# Patient Record
Sex: Male | Born: 1965 | Race: White | Hispanic: No | Marital: Single | Smoking: Former smoker
Health system: Southern US, Community
[De-identification: ages and names within clinical notes are randomized; demographics above are authoritative.]

## PROBLEM LIST (undated history)

## (undated) DIAGNOSIS — G4733 Obstructive sleep apnea (adult) (pediatric): Secondary | ICD-10-CM

## (undated) DIAGNOSIS — I2510 Atherosclerotic heart disease of native coronary artery without angina pectoris: Secondary | ICD-10-CM

## (undated) DIAGNOSIS — R7303 Prediabetes: Secondary | ICD-10-CM

## (undated) DIAGNOSIS — I214 Non-ST elevation (NSTEMI) myocardial infarction: Secondary | ICD-10-CM

## (undated) DIAGNOSIS — I10 Essential (primary) hypertension: Secondary | ICD-10-CM

## (undated) DIAGNOSIS — E785 Hyperlipidemia, unspecified: Secondary | ICD-10-CM

## (undated) HISTORY — DX: Essential (primary) hypertension: I10

## (undated) HISTORY — DX: Prediabetes: R73.03

## (undated) HISTORY — DX: Hyperlipidemia, unspecified: E78.5

## (undated) HISTORY — DX: Obstructive sleep apnea (adult) (pediatric): G47.33

---
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
## 2000-04-24 ENCOUNTER — Other Ambulatory Visit: Admission: RE | Admit: 2000-04-24 | Discharge: 2000-04-24 | Payer: Self-pay | Admitting: Urology

## 2000-04-24 ENCOUNTER — Encounter (INDEPENDENT_AMBULATORY_CARE_PROVIDER_SITE_OTHER): Payer: Self-pay | Admitting: Specialist

## 2005-09-12 ENCOUNTER — Ambulatory Visit: Payer: Self-pay | Admitting: Internal Medicine

## 2005-09-22 ENCOUNTER — Ambulatory Visit: Payer: Self-pay | Admitting: *Deleted

## 2005-10-03 ENCOUNTER — Ambulatory Visit: Payer: Self-pay | Admitting: Internal Medicine

## 2005-10-10 ENCOUNTER — Ambulatory Visit: Payer: Self-pay | Admitting: *Deleted

## 2005-10-31 ENCOUNTER — Ambulatory Visit: Payer: Self-pay | Admitting: *Deleted

## 2005-11-10 ENCOUNTER — Ambulatory Visit: Payer: Self-pay | Admitting: Internal Medicine

## 2005-11-17 ENCOUNTER — Ambulatory Visit: Payer: Self-pay | Admitting: *Deleted

## 2005-12-22 ENCOUNTER — Ambulatory Visit: Payer: Self-pay | Admitting: Internal Medicine

## 2006-04-10 ENCOUNTER — Ambulatory Visit: Payer: Self-pay | Admitting: Internal Medicine

## 2006-10-12 ENCOUNTER — Ambulatory Visit: Payer: Self-pay | Admitting: Internal Medicine

## 2012-04-22 ENCOUNTER — Emergency Department (HOSPITAL_COMMUNITY)
Admission: EM | Admit: 2012-04-22 | Discharge: 2012-04-22 | Disposition: A | Discharge status: Home Health | Payer: Self-pay | Attending: Emergency Medicine | Admitting: Emergency Medicine

## 2012-04-22 ENCOUNTER — Encounter (HOSPITAL_COMMUNITY): Payer: Self-pay | Admitting: Emergency Medicine

## 2012-04-22 ENCOUNTER — Emergency Department (HOSPITAL_COMMUNITY): Payer: Self-pay

## 2012-04-22 DIAGNOSIS — J40 Bronchitis, not specified as acute or chronic: Secondary | ICD-10-CM | POA: Insufficient documentation

## 2012-04-22 MED ORDER — ALBUTEROL SULFATE HFA 108 (90 BASE) MCG/ACT IN AERS
1.0000 | INHALATION_SPRAY | RESPIRATORY_TRACT | Status: DC | PRN
  Administered 2012-04-22: 2 via RESPIRATORY_TRACT
  Filled 2012-04-22: qty 6.7

## 2012-04-22 MED ORDER — PREDNISONE 10 MG PO TABS: 40.0000 mg | ORAL_TABLET | Freq: Every day | ORAL | Status: DC

## 2012-04-22 MED ORDER — HYDROCODONE-HOMATROPINE 5-1.5 MG/5ML PO SYRP: 5.0000 mL | ORAL_SOLUTION | Freq: Three times a day (TID) | ORAL | Status: AC | PRN

## 2012-04-22 MED ORDER — AZITHROMYCIN 250 MG PO TABS: 250.0000 mg | ORAL_TABLET | Freq: Every day | ORAL | Status: AC

## 2012-04-22 NOTE — ED Provider Notes (Signed)
History    This chart was scribed for Rolan Bucco, MD, MD by Smitty Pluck. The patient was seen in room TR07C and the patient's care was started at 1:11PM.   CSN: [id number]  Arrival date & time 04/22/12  1203   First MD Initiated Contact with Patient 04/22/12 1256      Chief Complaint  Patient presents with  . URI    (Consider location/radiation/quality/duration/timing/severity/associated sxs/prior treatment) Patient is a 46 y.o. male presenting with URI. The history is provided by the patient.  URI The primary symptoms include cough.   ABIMELEC GROCHOWSKI is a 46 y.o. male who presents to the Emergency Department complaining of constant moderate cough and congestion onset 3 days. Pt reports that he was coughing up phlegm the 1st day but since then he has had non productive cough. Denies fevers, chills, nausea, abdominal pain, chest pain, rashes. Denies smoking cigarettes. Denies hx of asthma, wheezing and lung problems.  Reports he works in [employer] and is constantly around dust. He reports that he has been unable to sleep due to cough.   History reviewed. No pertinent past medical history.  History reviewed. No pertinent past surgical history.  History reviewed. No pertinent family history.  History  Substance Use Topics  . Smoking status: Never Smoker   . Smokeless tobacco: Not on file  . Alcohol Use: Yes     occasional      Review of Systems  Respiratory: Positive for cough.   10 Systems reviewed and all are negative for acute change except as noted in the HPI.    Allergies  Review of patient's allergies indicates no known allergies.  Home Medications   Current Outpatient Rx  Name Route Sig Dispense Refill  . AZITHROMYCIN 250 MG PO TABS Oral Take 1 tablet (250 mg total) by mouth daily. Take first 2 tablets together, then 1 every day until finished. 6 tablet 0  . HYDROCODONE-HOMATROPINE 5-1.5 MG/5ML PO SYRP Oral Take 5 mLs by mouth every 8 (eight) hours as  needed for cough. 120 mL 0  . PREDNISONE 10 MG PO TABS Oral Take 4 tablets (40 mg total) by mouth daily. 20 tablet 0    BP 134/84  Pulse 94  Temp 98.5 F (36.9 C) (Oral)  Resp 20  SpO2 94%  Physical Exam  Nursing note and vitals reviewed. Constitutional: He is oriented to person, place, and time. He appears well-developed and well-nourished.  HENT:  Head: Normocephalic and atraumatic.  Eyes: Pupils are equal, round, and reactive to light.  Neck: Normal range of motion. Neck supple.  Cardiovascular: Normal rate, regular rhythm and normal heart sounds.   Pulmonary/Chest: Effort normal. No respiratory distress. He has wheezes (bilaterally). He has rhonchi (bilaterally). He has no rales. He exhibits no tenderness.  Abdominal: Soft. Bowel sounds are normal. There is no tenderness. There is no rebound and no guarding.  Musculoskeletal: Normal range of motion. He exhibits no edema.  Lymphadenopathy:    He has no cervical adenopathy.  Neurological: He is alert and oriented to person, place, and time.  Skin: Skin is warm and dry. No rash noted.  Psychiatric: He has a normal mood and affect.    ED Course  Procedures (including critical care time) DIAGNOSTIC STUDIES: Oxygen Saturation is 94% on room air, adequate by my interpretation.    COORDINATION OF CARE:    Labs Reviewed - No data to display Dg Chest 2 View  04/22/2012  *RADIOLOGY REPORT*  Clinical Data: Cough and  congestion.  CHEST - 2 VIEW  Comparison: None.  Findings: Trachea is midline.  Heart size normal.  Lungs are clear. No pleural fluid.  IMPRESSION: No acute findings.  Original Report Authenticated By: Reyes Ivan, M.D.     1. Bronchitis       MDM  Will start pt on abx, albuterol, steroids, cough meds.  F/u in a few days if no improvement or return here for any worsening symptoms  I personally performed the services described in this documentation, which was scribed in my presence.  The recorded  information has been reviewed and considered.      Rolan Bucco, MD 04/22/12 1320

## 2012-04-22 NOTE — ED Notes (Signed)
Pt c/o URI sx with cough x 3 days; pt denies fever; pt sts productive cough with brown colored sputum

## 2012-04-22 NOTE — ED Notes (Signed)
C/o SOB when working, states heat makes worse, nonproductive cough, taking child's pediacare

## 2013-07-06 ENCOUNTER — Ambulatory Visit: Payer: Self-pay

## 2013-07-06 ENCOUNTER — Other Ambulatory Visit: Payer: Self-pay | Admitting: Occupational Medicine

## 2013-07-06 DIAGNOSIS — R52 Pain, unspecified: Secondary | ICD-10-CM

## 2014-11-03 ENCOUNTER — Emergency Department (HOSPITAL_BASED_OUTPATIENT_CLINIC_OR_DEPARTMENT_OTHER)
Admission: EM | Admit: 2014-11-03 | Discharge: 2014-11-03 | Disposition: A | Discharge status: Home / Self Care | Payer: Self-pay

## 2019-04-05 ENCOUNTER — Inpatient Hospital Stay (HOSPITAL_COMMUNITY)
Admission: EM | Admit: 2019-04-05 | Discharge: 2019-04-06 | DRG: 247 | Disposition: A | Discharge status: Home / Self Care | Payer: Self-pay | Attending: Cardiovascular Disease | Admitting: Cardiovascular Disease

## 2019-04-05 ENCOUNTER — Encounter (HOSPITAL_COMMUNITY)
Admission: EM | Disposition: A | Discharge status: Home / Self Care | Payer: Self-pay | Source: Home / Self Care | Attending: Cardiovascular Disease

## 2019-04-05 ENCOUNTER — Emergency Department (HOSPITAL_COMMUNITY): Payer: Self-pay

## 2019-04-05 ENCOUNTER — Other Ambulatory Visit: Payer: Self-pay

## 2019-04-05 ENCOUNTER — Encounter (HOSPITAL_COMMUNITY): Payer: Self-pay | Admitting: Emergency Medicine

## 2019-04-05 DIAGNOSIS — I214 Non-ST elevation (NSTEMI) myocardial infarction: Principal | ICD-10-CM

## 2019-04-05 DIAGNOSIS — R7303 Prediabetes: Secondary | ICD-10-CM | POA: Diagnosis present

## 2019-04-05 DIAGNOSIS — Z20828 Contact with and (suspected) exposure to other viral communicable diseases: Secondary | ICD-10-CM | POA: Diagnosis present

## 2019-04-05 DIAGNOSIS — I2510 Atherosclerotic heart disease of native coronary artery without angina pectoris: Secondary | ICD-10-CM

## 2019-04-05 DIAGNOSIS — Z955 Presence of coronary angioplasty implant and graft: Secondary | ICD-10-CM

## 2019-04-05 DIAGNOSIS — I10 Essential (primary) hypertension: Secondary | ICD-10-CM

## 2019-04-05 DIAGNOSIS — Z87891 Personal history of nicotine dependence: Secondary | ICD-10-CM

## 2019-04-05 DIAGNOSIS — R079 Chest pain, unspecified: Secondary | ICD-10-CM

## 2019-04-05 DIAGNOSIS — E119 Type 2 diabetes mellitus without complications: Secondary | ICD-10-CM

## 2019-04-05 DIAGNOSIS — I252 Old myocardial infarction: Secondary | ICD-10-CM | POA: Diagnosis present

## 2019-04-05 DIAGNOSIS — E785 Hyperlipidemia, unspecified: Secondary | ICD-10-CM

## 2019-04-05 DIAGNOSIS — R778 Other specified abnormalities of plasma proteins: Secondary | ICD-10-CM

## 2019-04-05 HISTORY — PX: CORONARY STENT INTERVENTION: CATH118234

## 2019-04-05 HISTORY — PX: LEFT HEART CATH AND CORONARY ANGIOGRAPHY: CATH118249

## 2019-04-05 HISTORY — DX: Non-ST elevation (NSTEMI) myocardial infarction: I21.4

## 2019-04-05 HISTORY — DX: Atherosclerotic heart disease of native coronary artery without angina pectoris: I25.10

## 2019-04-05 LAB — CBG MONITORING, ED: Glucose-Capillary: 123 mg/dL — ABNORMAL HIGH (ref 70–99)

## 2019-04-05 LAB — COMPREHENSIVE METABOLIC PANEL
ALT: 45 U/L — ABNORMAL HIGH (ref 0–44)
AST: 32 U/L (ref 15–41)
Albumin: 4.1 g/dL (ref 3.5–5.0)
Alkaline Phosphatase: 90 U/L (ref 38–126)
Anion gap: 13 (ref 5–15)
BUN: 14 mg/dL (ref 6–20)
CO2: 21 mmol/L — ABNORMAL LOW (ref 22–32)
Calcium: 9.5 mg/dL (ref 8.9–10.3)
Chloride: 104 mmol/L (ref 98–111)
Creatinine, Ser: 1.33 mg/dL — ABNORMAL HIGH (ref 0.61–1.24)
GFR calc Af Amer: >60 mL/min (ref >60)
GFR calc non Af Amer: >60 mL/min (ref >60)
Glucose, Bld: 118 mg/dL — ABNORMAL HIGH (ref 70–99)
Potassium: 4.3 mmol/L (ref 3.5–5.1)
Sodium: 138 mmol/L (ref 135–145)
Total Bilirubin: 0.6 mg/dL (ref 0.3–1.2)
Total Protein: 7.5 g/dL (ref 6.5–8.1)

## 2019-04-05 LAB — CBC
HCT: 50.7 % (ref 39.0–52.0)
Hemoglobin: 17.3 g/dL — ABNORMAL HIGH (ref 13.0–17.0)
MCH: 29.6 pg (ref 26.0–34.0)
MCHC: 34.1 g/dL (ref 30.0–36.0)
MCV: 86.8 fL (ref 80.0–100.0)
Platelets: 267 10*3/uL (ref 150–400)
RBC: 5.84 MIL/uL — ABNORMAL HIGH (ref 4.22–5.81)
RDW: 13.3 % (ref 11.5–15.5)
WBC: 13.3 10*3/uL — ABNORMAL HIGH (ref 4.0–10.5)
nRBC: 0 % (ref 0.0–0.2)

## 2019-04-05 LAB — PROTIME-INR
INR: 1 (ref 0.8–1.2)
Prothrombin Time: 13.4 seconds (ref 11.4–15.2)

## 2019-04-05 LAB — HEMOGLOBIN A1C
Hgb A1c MFr Bld: 6.4 % — ABNORMAL HIGH (ref 4.8–5.6)
Mean Plasma Glucose: 136.98 mg/dL

## 2019-04-05 LAB — TROPONIN I (HIGH SENSITIVITY)
Troponin I (High Sensitivity): 9 ng/L (ref <18)
Troponin I (High Sensitivity): 42 ng/L — ABNORMAL HIGH (ref <18)
Troponin I (High Sensitivity): 231 ng/L (ref <18)

## 2019-04-05 LAB — LIPASE, BLOOD: Lipase: 48 U/L (ref 11–51)

## 2019-04-05 LAB — POCT ACTIVATED CLOTTING TIME: Activated Clotting Time: 290 seconds

## 2019-04-05 LAB — SARS CORONAVIRUS 2 BY RT PCR (HOSPITAL ORDER, PERFORMED IN ~~LOC~~ HOSPITAL LAB): SARS Coronavirus 2: NEGATIVE

## 2019-04-05 LAB — TSH: TSH: 2.894 u[IU]/mL (ref 0.350–4.500)

## 2019-04-05 SURGERY — LEFT HEART CATH AND CORONARY ANGIOGRAPHY
Anesthesia: LOCAL

## 2019-04-05 MED ORDER — SODIUM CHLORIDE 0.9 % IV SOLN: 250.0000 mL | INTRAVENOUS | Status: DC | PRN

## 2019-04-05 MED ORDER — IOHEXOL 350 MG/ML SOLN
INTRAVENOUS | Status: DC | PRN
  Administered 2019-04-05: 95 mL via INTRACARDIAC

## 2019-04-05 MED ORDER — ALPRAZOLAM 0.25 MG PO TABS: 0.2500 mg | ORAL_TABLET | Freq: Two times a day (BID) | ORAL | Status: DC | PRN

## 2019-04-05 MED ORDER — SODIUM CHLORIDE 0.9 % IV SOLN: INTRAVENOUS | Status: AC

## 2019-04-05 MED ORDER — ATORVASTATIN CALCIUM 80 MG PO TABS: 80.0000 mg | ORAL_TABLET | Freq: Every day | ORAL | Status: DC

## 2019-04-05 MED ORDER — METOPROLOL TARTRATE 25 MG PO TABS
25.0000 mg | ORAL_TABLET | Freq: Two times a day (BID) | ORAL | Status: DC
  Administered 2019-04-05 – 2019-04-06 (×2): 25 mg via ORAL
  Filled 2019-04-05 (×2): qty 1

## 2019-04-05 MED ORDER — NITROGLYCERIN 0.4 MG SL SUBL: 0.4000 mg | SUBLINGUAL_TABLET | SUBLINGUAL | Status: DC | PRN

## 2019-04-05 MED ORDER — LIDOCAINE HCL (PF) 1 % IJ SOLN
INTRAMUSCULAR | Status: AC
  Filled 2019-04-05: qty 30

## 2019-04-05 MED ORDER — ASPIRIN 81 MG PO CHEW
81.0000 mg | CHEWABLE_TABLET | Freq: Every day | ORAL | Status: DC
  Administered 2019-04-06: 10:00:00 81 mg via ORAL
  Filled 2019-04-05: qty 1

## 2019-04-05 MED ORDER — SODIUM CHLORIDE 0.9% FLUSH
3.0000 mL | Freq: Two times a day (BID) | INTRAVENOUS | Status: DC
  Administered 2019-04-06: 3 mL via INTRAVENOUS

## 2019-04-05 MED ORDER — ACETAMINOPHEN 325 MG PO TABS: 650.0000 mg | ORAL_TABLET | ORAL | Status: DC | PRN

## 2019-04-05 MED ORDER — MORPHINE SULFATE (PF) 4 MG/ML IV SOLN
4.0000 mg | Freq: Once | INTRAVENOUS | Status: AC
  Administered 2019-04-05: 4 mg via INTRAVENOUS
  Filled 2019-04-05: qty 1

## 2019-04-05 MED ORDER — MIDAZOLAM HCL 2 MG/2ML IJ SOLN
INTRAMUSCULAR | Status: AC
  Filled 2019-04-05: qty 2

## 2019-04-05 MED ORDER — TICAGRELOR 90 MG PO TABS
ORAL_TABLET | ORAL | Status: DC | PRN
  Administered 2019-04-05: 180 mg via ORAL

## 2019-04-05 MED ORDER — SODIUM CHLORIDE 0.9% FLUSH: 3.0000 mL | INTRAVENOUS | Status: DC | PRN

## 2019-04-05 MED ORDER — HEPARIN (PORCINE) 25000 UT/250ML-% IV SOLN
1400.0000 [IU]/h | INTRAVENOUS | Status: DC
  Administered 2019-04-05: 1400 [IU]/h via INTRAVENOUS
  Filled 2019-04-05: qty 250

## 2019-04-05 MED ORDER — HEART ATTACK BOUNCING BOOK
Freq: Once | Status: AC
  Administered 2019-04-05: 1
  Filled 2019-04-05: qty 1

## 2019-04-05 MED ORDER — HEPARIN (PORCINE) IN NACL 1000-0.9 UT/500ML-% IV SOLN
INTRAVENOUS | Status: AC
  Filled 2019-04-05: qty 1000

## 2019-04-05 MED ORDER — FENTANYL CITRATE (PF) 100 MCG/2ML IJ SOLN
INTRAMUSCULAR | Status: AC
  Filled 2019-04-05: qty 2

## 2019-04-05 MED ORDER — HYDRALAZINE HCL 20 MG/ML IJ SOLN: 10.0000 mg | INTRAMUSCULAR | Status: AC | PRN

## 2019-04-05 MED ORDER — OXYCODONE HCL 5 MG PO TABS: 5.0000 mg | ORAL_TABLET | ORAL | Status: DC | PRN

## 2019-04-05 MED ORDER — FENTANYL CITRATE (PF) 100 MCG/2ML IJ SOLN
INTRAMUSCULAR | Status: DC | PRN
  Administered 2019-04-05: 25 ug via INTRAVENOUS

## 2019-04-05 MED ORDER — NITROGLYCERIN IN D5W 200-5 MCG/ML-% IV SOLN
0.0000 ug/min | INTRAVENOUS | Status: DC
  Administered 2019-04-05: 5 ug/min via INTRAVENOUS
  Filled 2019-04-05: qty 250

## 2019-04-05 MED ORDER — HEPARIN SODIUM (PORCINE) 1000 UNIT/ML IJ SOLN
INTRAMUSCULAR | Status: DC | PRN
  Administered 2019-04-05: 2000 [IU] via INTRAVENOUS
  Administered 2019-04-05 (×2): 6000 [IU] via INTRAVENOUS

## 2019-04-05 MED ORDER — SODIUM CHLORIDE 0.9 % IV SOLN
1000.0000 mL | INTRAVENOUS | Status: DC
  Administered 2019-04-05: 1000 mL via INTRAVENOUS

## 2019-04-05 MED ORDER — VERAPAMIL HCL 2.5 MG/ML IV SOLN
INTRAVENOUS | Status: DC | PRN
  Administered 2019-04-05: 10 mL via INTRA_ARTERIAL

## 2019-04-05 MED ORDER — ASPIRIN 81 MG PO CHEW
324.0000 mg | CHEWABLE_TABLET | Freq: Once | ORAL | Status: AC
  Administered 2019-04-05: 324 mg via ORAL
  Filled 2019-04-05: qty 4

## 2019-04-05 MED ORDER — HEPARIN (PORCINE) IN NACL 1000-0.9 UT/500ML-% IV SOLN
INTRAVENOUS | Status: DC | PRN
  Administered 2019-04-05 (×2): 500 mL

## 2019-04-05 MED ORDER — SODIUM CHLORIDE 0.9 % IV BOLUS (SEPSIS)
1000.0000 mL | Freq: Once | INTRAVENOUS | Status: AC
  Administered 2019-04-05: 1000 mL via INTRAVENOUS

## 2019-04-05 MED ORDER — ALUM & MAG HYDROXIDE-SIMETH 200-200-20 MG/5ML PO SUSP
30.0000 mL | Freq: Once | ORAL | Status: AC
  Administered 2019-04-05: 30 mL via ORAL
  Filled 2019-04-05: qty 30

## 2019-04-05 MED ORDER — LABETALOL HCL 5 MG/ML IV SOLN: 10.0000 mg | INTRAVENOUS | Status: AC | PRN

## 2019-04-05 MED ORDER — TICAGRELOR 90 MG PO TABS
90.0000 mg | ORAL_TABLET | Freq: Two times a day (BID) | ORAL | Status: DC
  Administered 2019-04-06: 90 mg via ORAL
  Filled 2019-04-05: qty 1

## 2019-04-05 MED ORDER — THE SENSUOUS HEART BOOK
Freq: Once | Status: AC
  Administered 2019-04-05
  Filled 2019-04-05: qty 1

## 2019-04-05 MED ORDER — SODIUM CHLORIDE 0.9% FLUSH
3.0000 mL | Freq: Two times a day (BID) | INTRAVENOUS | Status: DC
  Administered 2019-04-05: 3 mL via INTRAVENOUS

## 2019-04-05 MED ORDER — HEPARIN BOLUS VIA INFUSION
4000.0000 [IU] | Freq: Once | INTRAVENOUS | Status: AC
  Administered 2019-04-05: 4000 [IU] via INTRAVENOUS
  Filled 2019-04-05: qty 4000

## 2019-04-05 MED ORDER — ONDANSETRON HCL 4 MG/2ML IJ SOLN: 4.0000 mg | Freq: Four times a day (QID) | INTRAMUSCULAR | Status: DC | PRN

## 2019-04-05 MED ORDER — LIDOCAINE HCL (PF) 1 % IJ SOLN
INTRAMUSCULAR | Status: DC | PRN
  Administered 2019-04-05: 2 mL

## 2019-04-05 MED ORDER — MIDAZOLAM HCL 2 MG/2ML IJ SOLN
INTRAMUSCULAR | Status: DC | PRN
  Administered 2019-04-05: 2 mg via INTRAVENOUS

## 2019-04-05 MED ORDER — TICAGRELOR 90 MG PO TABS
ORAL_TABLET | ORAL | Status: AC
  Filled 2019-04-05: qty 2

## 2019-04-05 MED ORDER — NITROGLYCERIN 2 % TD OINT
1.0000 [in_us] | TOPICAL_OINTMENT | Freq: Four times a day (QID) | TRANSDERMAL | Status: DC
  Administered 2019-04-05: 1 [in_us] via TOPICAL
  Filled 2019-04-05: qty 1

## 2019-04-05 MED ORDER — VERAPAMIL HCL 2.5 MG/ML IV SOLN
INTRAVENOUS | Status: AC
  Filled 2019-04-05: qty 2

## 2019-04-05 MED ORDER — ZOLPIDEM TARTRATE 5 MG PO TABS: 5.0000 mg | ORAL_TABLET | Freq: Every evening | ORAL | Status: DC | PRN

## 2019-04-05 MED ORDER — NITROGLYCERIN 1 MG/10 ML FOR IR/CATH LAB
INTRA_ARTERIAL | Status: AC
  Filled 2019-04-05: qty 10

## 2019-04-05 MED ORDER — HEPARIN SODIUM (PORCINE) 1000 UNIT/ML IJ SOLN
INTRAMUSCULAR | Status: AC
  Filled 2019-04-05: qty 1

## 2019-04-05 SURGICAL SUPPLY — 18 items
BALLN SAPPHIRE 2.5X12 (BALLOONS) ×2
BALLN SAPPHIRE ~~LOC~~ 3.5X10 (BALLOONS) ×1 IMPLANT
BALLOON SAPPHIRE 2.5X12 (BALLOONS) IMPLANT
CATH 5FR JL3.5 JR4 ANG PIG MP (CATHETERS) ×1 IMPLANT
CATH VISTA GUIDE 6FR XBLAD3.5 (CATHETERS) ×1 IMPLANT
DEVICE RAD COMP TR BAND LRG (VASCULAR PRODUCTS) ×1 IMPLANT
GLIDESHEATH SLEND SS 6F .021 (SHEATH) ×1 IMPLANT
GUIDEWIRE INQWIRE 1.5J.035X260 (WIRE) IMPLANT
INQWIRE 1.5J .035X260CM (WIRE) ×2
KIT ENCORE 26 ADVANTAGE (KITS) ×1 IMPLANT
KIT HEART LEFT (KITS) ×2 IMPLANT
KIT HEMO VALVE WATCHDOG (MISCELLANEOUS) ×1 IMPLANT
PACK CARDIAC CATHETERIZATION (CUSTOM PROCEDURE TRAY) ×2 IMPLANT
STENT SYNERGY DES 3X12 (Permanent Stent) ×1 IMPLANT
SYR MEDRAD MARK 7 150ML (SYRINGE) ×1 IMPLANT
TRANSDUCER W/STOPCOCK (MISCELLANEOUS) ×2 IMPLANT
TUBING CIL FLEX 10 FLL-RA (TUBING) ×2 IMPLANT
WIRE COUGAR XT STRL 190CM (WIRE) ×1 IMPLANT

## 2019-04-05 NOTE — Progress Notes (Addendum)
No air taken out of TR band as of yet and pt called stating that blood was running down his arm. Added 3 cc to band making a total of 16 cc and will continue to monitor for bleeding.

## 2019-04-05 NOTE — ED Notes (Signed)
Cards PA at bedside. 

## 2019-04-05 NOTE — Interval H&P Note (Signed)
History and Physical Interval Note:  04/05/2019 4:48 PM  Kevin [name]  has presented today for surgery, with the diagnosis of n stemi.  The various methods of treatment have been discussed with the patient and family. After consideration of risks, benefits and other options for treatment, the patient has consented to  Procedure(s): LEFT HEART CATH AND CORONARY ANGIOGRAPHY (N/A) as a surgical intervention.  The patient's history has been reviewed, patient examined, no change in status, stable for surgery.  I have reviewed the patient's chart and labs.  Questions were answered to the patient's satisfaction.     Sherren Mocha

## 2019-04-05 NOTE — ED Notes (Signed)
Increased CP reported to EDP and new EKG provided.

## 2019-04-05 NOTE — Progress Notes (Signed)
[place] for heparin Indication: chest pain/ACS  Heparin Dosing Weight: 106.7 kg  Labs: Recent Labs    04/05/19 1052  HGB 17.3*  HCT 50.7  PLT 267  CREATININE 1.33*    Assessment: 45 yom presenting with NSTEMI. Pharmacy consulted to dose heparin. Not on anticoagulation PTA. No active bleed issues documented. CBC WNL, SCr 1.33 on admit.  Goal of Therapy:  Heparin level 0.3-0.7 units/ml Monitor platelets by anticoagulation protocol: Yes   Plan:  Heparin 4000 unit bolus Start heparin at 1400 units/h 6h heparin level Daily heparin level/CBC Monitor s/sx bleeding  Elicia Lamp, PharmD, BCPS Clinical Pharmacist 04/05/2019 2:32 PM

## 2019-04-05 NOTE — ED Notes (Signed)
Blood drawn in triage

## 2019-04-05 NOTE — ED Triage Notes (Signed)
C/o chest pain intermittent for 1 month- worse when sleeping -- no change with exertion

## 2019-04-05 NOTE — H&P (Addendum)
Cardiology Admission History and Physical:   Patient ID: Kevin [name]; MRN: [id number]; DOB: 01/24/1966   Admission date: 04/05/2019  Primary Care Provider: Pecola LawlessHopper, William F, MD, >10 years ago Primary Cardiologist: Tonny BollmanMichael Tahliyah Anagnos, MD new Primary Electrophysiologist:  None   Chief Complaint:  NSTEMI  Patient Profile:   Kevin SkyeRaymond L Manni is a 53 y.o. male with no known PMH, no checkup in > 10 yrs, last ER visit for URI was 2013.  History of Present Illness:   Mr. Atha StarksWelborn has been having intermittent chest pain for about a month. At first, once a day. He moved around, no change with exertion, position change or deep inspiration. 10/10 at its worst. No SOB, N&V, diaphoresis.  No relation to meals, time of day.  Today, it happened again shortly after waking, but it got worse about 10 am, and he came to the ER. He got ASA 324 mg, morphine 4 mg and GI cocktail. The meds helped but the pain is getting worse again.  He had nitroglycerin paste applied just as I came in the door.  As I was talking to him about his symptoms, his pain spiked again and he appeared acutely uncomfortable.  ECG done at that time is not a STEMI.  However, the pain eased off in a short period of time, concerning for response to nitrates.  Before a month ago, he had never had anything like this before.  He has no history of dyspnea on exertion, or exertional chest pain.  He has no problems with lower extremity edema, orthopnea or PND.  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  However, he has not felt limited at all by shortness of breath, and exertion does not seem to bring on or worsen his chest pain.  He has no history of palpitations, presyncope or syncope.  He has not had any cough, fever, or chills.  No new over-the-counter medications, he takes vitamin C chronically to try to stay healthy.  Past Medical History:  Diagnosis Date  . NSTEMI (non-ST elevated  myocardial infarction) ([facility]) 04/05/2019    History reviewed. No pertinent surgical history.   Medications Prior to Admission: Prior to Admission medications   Medication Sig Start Date End Date Taking? Authorizing Provider  vitamin C (ASCORBIC ACID) 500 MG tablet Take 500 mg by mouth 2 (two) times daily.   Yes [provider]  predniSONE (DELTASONE) 10 MG tablet Take 4 tablets (40 mg total) by mouth daily. Patient not taking: Reported on 04/05/2019 04/22/12   Rolan BuccoBelfi, Melanie, MD     Allergies:   No Known Allergies  Social History:   Social History   Socioeconomic History  . Marital status: Married    Spouse name: Not on file  . Number of children: Not on file  . Years of education: Not on file  . Highest education level: Not on file  Occupational History  . Occupation: plumber's helper  Social Needs  . Financial resource strain: Not on file  . Food insecurity    Worry: Not on file    Inability: Not on file  . Transportation needs    Medical: Not on file    Non-medical: Not on file  Tobacco Use  . Smoking status: Former Smoker    Types: Cigarettes    Quit date: 10/06/2006    Years since quitting: 12.5  . Smokeless tobacco: Never Used  Substance and Sexual Activity  . Alcohol use:  Not Currently    Comment:    . Drug use: No  . Sexual activity: Not on file  Lifestyle  . Physical activity    Days per week: Not on file    Minutes per session: Not on file  . Stress: Not on file  Relationships  . Social Musicianconnections    Talks on phone: Not on file    Gets together: Not on file    Attends religious service: Not on file    Active member of club or organization: Not on file    Attends meetings of clubs or organizations: Not on file    Relationship status: Not on file  . Intimate partner violence    Fear of current or ex partner: Not on file    Emotionally abused: Not on file    Physically abused: Not on file    Forced sexual activity: Not on file  Other Topics  Concern  . Not on file  Social History Narrative   [place]    Family History:   The patient's family history includes Diabetes in his father.   The patient He indicated that his mother is alive. He indicated that his father is alive. He indicated that his maternal grandmother is deceased. He indicated that his maternal grandfather is deceased. He indicated that his paternal grandmother is deceased. He indicated that his paternal grandfather is deceased.  ROS:  Please see the history of present illness.  All other ROS reviewed and negative.     Physical Exam/Data:   Vitals:   04/05/19 1230 04/05/19 1245 04/05/19 1300 04/05/19 1330  BP: (!) 138/92 125/87 139/89 (!) 144/87  Pulse: 70 68 70 66  Resp: 19 14 15 13   Temp:      TempSrc:      SpO2: 96% 97% 95% 97%  Weight:      Height:       No intake or output data in the 24 hours ending 04/05/19 1421 Filed Weights   04/05/19 1044  Weight: 122.5 kg   Body mass index is 35.62 kg/m.  General:  Well nourished, well developed, intermittent distress HEENT: normal Lymph: no adenopathy Neck:  JVD not elevated Endocrine:  No thryomegaly Vascular: No carotid bruits; FA pulses 2+ bilaterally   Cardiac:  normal S1, S2; RRR; no murmur, no rub or gallop  Lungs:  clear to auscultation bilaterally, no wheezing, rhonchi or rales  Abd: soft, nontender, no hepatomegaly  Ext: No edema Musculoskeletal:  No deformities, BUE and BLE strength normal and equal Skin: warm and dry  Neuro:  CNs 2-12 intact, no focal abnormalities noted Psych:  Normal affect    EKG:  The ECG that was done 6/30 at 10:17 AM was personally reviewed and demonstrates sinus rhythm, inferior and less lateral ST depression, improved on ECG performed at 1:49 p.m.  Relevant CV Studies:  None  Laboratory Data:  Chemistry Recent Labs  Lab 04/05/19 1052  NA 138  K 4.3  CL 104  CO2 21*  GLUCOSE 118*  BUN 14  CREATININE 1.33*  CALCIUM 9.5   GFRNONAA >60  GFRAA >60  ANIONGAP 13    Recent Labs  Lab 04/05/19 1052  PROT 7.5  ALBUMIN 4.1  AST 32  ALT 45*  ALKPHOS 90  BILITOT 0.6   Hematology Recent Labs  Lab 04/05/19 1052  WBC 13.3*  RBC 5.84*  HGB 17.3*  HCT 50.7  MCV 86.8  MCH 29.6  MCHC 34.1  RDW  13.3  PLT 267   High Sensitivity Troponin:   Recent Labs  Lab 04/05/19 1052 04/05/19 1222  TROPONINIHS 9 42*      Radiology/Studies:  Dg Chest Portable 1 View  Result Date: 04/05/2019 CLINICAL DATA:  Chest pain. EXAM: PORTABLE CHEST 1 VIEW COMPARISON:  04/22/2012 FINDINGS: The heart size and mediastinal contours are within normal limits. Both lungs are clear. The visualized skeletal structures are unremarkable. IMPRESSION: Normal exam. Electronically Signed   By: Lorriane Shire M.D.   On: 04/05/2019 11:08    Assessment and Plan:   COVID pending  Principal Problem:   NSTEMI (non-ST elevated myocardial infarction) ([facility]) -Admit, continue to cycle enzymes - He is being started on heparin and IV nitro -COVID swab is ordered - No known history of HTN, HLD, DM, will screen for all 3 -With chest pain, elevated cardiac enzymes, and dynamic ECG changes, cardiac catheterization is indicated - The risks and benefits of a cardiac catheterization including, but not limited to, death, stroke, MI, kidney damage and bleeding were discussed with the patient who indicates understanding and agrees to proceed.  -I also spoke to his mother on the phone at his request.  He wishes her to be called with the results of the heart catheterization.  Her name is Melene Plan, phone number is [phone]    Elevated creatinine: - He has not had labs in 10 years or more - he is being started on hydration with a saline bolus of 1000 mL and then 125 cc an hour. -Follow labs  For questions or updates, please contact [place] Please consult [email or web address] for contact info under Cardiology/STEMI.    Signed, Rosaria Ferries,  PA-C  04/05/2019 2:21 PM   Patient seen, examined. Available data reviewed. Agree with findings, assessment, and plan as outlined by Rosaria Ferries, PA-C.  The patient is independently interviewed and examined.  He is alert, oriented, no acute distress.  JVP is normal, no carotid bruits, lungs are clear, heart is regular rate and rhythm no murmur gallop, abdomen is soft and nontender, extremities show no edema.  The patient is at high risk of acute coronary syndrome with mildly elevated troponin, chest pain syndrome at rest relieved by heparin and nitroglycerin, and nonspecific EKG changes.  He had severe chest pressure and discomfort today, prompting emergency room evaluation.  I have recommended definitive evaluation with cardiac catheterization as outlined above.  I have reviewed the risks, indications, and alternatives to cardiac catheterization and possible angioplasty and stenting with the patient.  He understands and agrees to proceed.  His COVID-19 test is negative.  Sherren Mocha, M.D. 04/05/2019 4:09 PM

## 2019-04-05 NOTE — ED Provider Notes (Signed)
[facility] EMERGENCY DEPARTMENT Provider Note   CSN: [id number] Arrival date & time: 04/05/19  1013    History   Chief Complaint Chief Complaint  [name] presents with  . Chest Pain    HPI Kevin [name] is a 53 y.o. [name].  Kevin [name] presents for evaluation of chest pain. Initial onset of pain was more than 6 hours ago. The [name]'s chest pain is well-localized, is sharp and is not worse with exertion. The [name]'s chest pain is middle- or left-sided, is not described as heaviness/pressure/tightness and does not radiate to the arms/jaw/neck. The [name] does not complain of nausea and denies diaphoresis. The [name] has no history of stroke, has no history of peripheral artery disease, has not smoked in the past 90 days, denies any history of treated diabetes, has no relevant family history of coronary artery disease (first degree relative at less than age 8), is not hypertensive, has no history of hypercholesterolemia and does not have an elevated BMI (>=30).  Kevin [name] has been having chest pain for several weeks.  He has noticed sx especially at night. During the day it would gradually go away.  One evening he had to get up and sleep upright on the cough.  It helped but last night when he started to have the pain again it did not go away. HPI  History reviewed. No pertinent past medical history.  There are no active problems to display for this [name].   History reviewed. No pertinent surgical history.      Home Medications    Prior to Admission medications   Medication Sig Start Date End Date Taking? Authorizing Provider  vitamin C (ASCORBIC ACID) 500 MG tablet Take 500 mg by mouth 2 (two) times daily.   Yes [provider]  predniSONE (DELTASONE) 10 MG tablet Take 4 tablets (40 mg total) by mouth daily. [name] not taking: Reported on 04/05/2019 04/22/12   Kevin Johns, MD    Family History No family history on file.   Social History Social History   Tobacco Use  . Smoking status: Never Smoker  . Smokeless tobacco: Never Used  Substance Use Topics  . Alcohol use: Yes    Comment: occasional  . Drug use: No     Allergies   [name] has no known allergies.   Review of Systems Review of Systems  All other systems reviewed and are negative.    Physical Exam Updated Vital Signs BP (!) 138/92   Pulse 70   Temp 98.1 F (36.7 C) (Oral)   Resp 19   Ht 1.854 m (6\' 1" )   Wt 122.5 kg   SpO2 96%   BMI 35.62 kg/m   Physical Exam Vitals signs and nursing note reviewed.  Constitutional:      General: He is not in acute distress.    Appearance: He is well-developed.  HENT:     Head: Normocephalic and atraumatic.     Right Ear: External ear normal.     Left Ear: External ear normal.  Eyes:     General: No scleral icterus.       Right eye: No discharge.        Left eye: No discharge.     Conjunctiva/sclera: Conjunctivae normal.  Neck:     Musculoskeletal: Neck supple.     Trachea: No tracheal deviation.  Cardiovascular:     Rate and Rhythm: Normal rate and regular rhythm.  Pulmonary:     Effort: Pulmonary  effort is normal. No respiratory distress.     Breath sounds: Normal breath sounds. No stridor. No wheezing or rales.  Abdominal:     General: Bowel sounds are normal. There is no distension.     Palpations: Abdomen is soft.     Tenderness: There is no abdominal tenderness. There is no guarding or rebound.  Musculoskeletal:        General: No tenderness.  Skin:    General: Skin is warm and dry.     Findings: No rash.  Neurological:     Mental Status: He is alert.     Cranial Nerves: No cranial nerve deficit (no facial droop, extraocular movements intact, no slurred speech).     Sensory: No sensory deficit.     Motor: No abnormal muscle tone or seizure activity.     Coordination: Coordination normal.      ED Treatments / Results  Labs (all labs ordered are listed, but only  abnormal results are displayed) Labs Reviewed  TROPONIN I (HIGH SENSITIVITY) - Abnormal; Notable for the following components:      Result Value   Troponin I (High Sensitivity) 42 (*)    All other components within normal limits  CBC - Abnormal; Notable for the following components:   WBC 13.3 (*)    RBC 5.84 (*)    Hemoglobin 17.3 (*)    All other components within normal limits  COMPREHENSIVE METABOLIC PANEL - Abnormal; Notable for the following components:   CO2 21 (*)    Glucose, Bld 118 (*)    Creatinine, Ser 1.33 (*)    ALT 45 (*)    All other components within normal limits  CBG MONITORING, ED - Abnormal; Notable for the following components:   Glucose-Capillary 123 (*)    All other components within normal limits  TROPONIN I (HIGH SENSITIVITY)  LIPASE, BLOOD    EKG EKG Interpretation  Date/Time:  Tuesday April 05 2019 11:41:51 EDT Ventricular Rate:  73 PR Interval:  146 QRS Duration: 102 QT Interval:  371 QTC Calculation: 409 R Axis:   69 Text Interpretation:  Sinus rhythm Anteroseptal infarct, old No significant change since last tracing Confirmed by Linwood DibblesKnapp, Makenze Ellett [phone]([id number]) on 04/05/2019 11:47:04 AM   Radiology Dg Chest Portable 1 View  Result Date: 04/05/2019 CLINICAL DATA:  Chest pain. EXAM: PORTABLE CHEST 1 VIEW COMPARISON:  04/22/2012 FINDINGS: The heart size and mediastinal contours are within normal limits. Both lungs are clear. The visualized skeletal structures are unremarkable. IMPRESSION: Normal exam. Electronically Signed   By: Francene BoyersJames  Maxwell M.D.   On: 04/05/2019 11:08    Procedures Procedures (including critical care time)  Medications Ordered in ED Medications  nitroGLYCERIN (NITROGLYN) 2 % ointment 1 inch (has no administration in time range)  aspirin chewable tablet 324 mg (324 mg Oral Given 04/05/19 1137)  alum & mag hydroxide-simeth (MAALOX/MYLANTA) 200-200-20 MG/5ML suspension 30 mL (30 mLs Oral Given 04/05/19 1138)  morphine 4 MG/ML injection 4  mg (4 mg Intravenous Given 04/05/19 1156)     Initial Impression / Assessment and Plan / ED Course  I have reviewed the triage vital signs and the nursing notes.  Pertinent labs & imaging results that were available during my care of the [name] were reviewed by me and considered in my medical decision making (see chart for details).  Clinical Course as of Apr 04 1320  Tue Apr 05, 2019  1152 Kevin [name] is still having pain.  Will give dose of morphine.   [  JK]  1318 [name] second troponin is 7176m this is a delta 33.  Per our pathway I will consult cardiology.   [JK]    Clinical Course User Index [JK] Linwood DibblesKnapp, Rosanne Wohlfarth, MD    HEAR Score: 2  [name] presented to the ED with chest pain.  [name] denies any significant medical history.  Overall low in the low risk heart score category but his delta troponin is 33.  I will consult with cardiology.  Final Clinical Impressions(s) / ED Diagnoses   Final diagnoses:  Chest pain, unspecified type  Elevated troponin      Linwood DibblesKnapp, Rufino Staup, MD 04/05/19 1322

## 2019-04-06 ENCOUNTER — Encounter (HOSPITAL_COMMUNITY): Payer: Self-pay | Admitting: Cardiovascular Disease

## 2019-04-06 DIAGNOSIS — R7303 Prediabetes: Secondary | ICD-10-CM

## 2019-04-06 DIAGNOSIS — E119 Type 2 diabetes mellitus without complications: Secondary | ICD-10-CM

## 2019-04-06 DIAGNOSIS — I10 Essential (primary) hypertension: Secondary | ICD-10-CM

## 2019-04-06 DIAGNOSIS — E785 Hyperlipidemia, unspecified: Secondary | ICD-10-CM

## 2019-04-06 LAB — BASIC METABOLIC PANEL
Anion gap: 10 (ref 5–15)
BUN: 11 mg/dL (ref 6–20)
CO2: 20 mmol/L — ABNORMAL LOW (ref 22–32)
Calcium: 8.8 mg/dL — ABNORMAL LOW (ref 8.9–10.3)
Chloride: 108 mmol/L (ref 98–111)
Creatinine, Ser: 1.15 mg/dL (ref 0.61–1.24)
GFR calc Af Amer: >60 mL/min (ref >60)
GFR calc non Af Amer: >60 mL/min (ref >60)
Glucose, Bld: 104 mg/dL — ABNORMAL HIGH (ref 70–99)
Potassium: 4.2 mmol/L (ref 3.5–5.1)
Sodium: 138 mmol/L (ref 135–145)

## 2019-04-06 LAB — CBC
HCT: 46.9 % (ref 39.0–52.0)
Hemoglobin: 15.9 g/dL (ref 13.0–17.0)
MCH: 29.4 pg (ref 26.0–34.0)
MCHC: 33.9 g/dL (ref 30.0–36.0)
MCV: 86.9 fL (ref 80.0–100.0)
Platelets: 253 10*3/uL (ref 150–400)
RBC: 5.4 MIL/uL (ref 4.22–5.81)
RDW: 13.3 % (ref 11.5–15.5)
WBC: 11.6 10*3/uL — ABNORMAL HIGH (ref 4.0–10.5)
nRBC: 0 % (ref 0.0–0.2)

## 2019-04-06 LAB — LIPID PANEL
Cholesterol: 199 mg/dL (ref 0–200)
HDL: 30 mg/dL — ABNORMAL LOW (ref >40)
LDL Cholesterol: 130 mg/dL — ABNORMAL HIGH (ref 0–99)
Total CHOL/HDL Ratio: 6.6 RATIO
Triglycerides: 194 mg/dL — ABNORMAL HIGH (ref <150)
VLDL: 39 mg/dL (ref 0–40)

## 2019-04-06 LAB — HIV ANTIBODY (ROUTINE TESTING W REFLEX): HIV Screen 4th Generation wRfx: NONREACTIVE

## 2019-04-06 LAB — TROPONIN I (HIGH SENSITIVITY): Troponin I (High Sensitivity): 176 ng/L (ref <18)

## 2019-04-06 MED ORDER — LOSARTAN POTASSIUM 25 MG PO TABS
25.0000 mg | ORAL_TABLET | Freq: Every day | ORAL | Status: DC
  Administered 2019-04-06: 13:00:00 25 mg via ORAL
  Filled 2019-04-06: qty 1

## 2019-04-06 MED ORDER — NITROGLYCERIN 0.4 MG SL SUBL: 0.4000 mg | SUBLINGUAL_TABLET | SUBLINGUAL | 2 refills | Status: DC | PRN

## 2019-04-06 MED ORDER — ATORVASTATIN CALCIUM 80 MG PO TABS: 80.0000 mg | ORAL_TABLET | Freq: Every day | ORAL | 1 refills | Status: DC

## 2019-04-06 MED ORDER — TICAGRELOR 90 MG PO TABS: 90.0000 mg | ORAL_TABLET | Freq: Two times a day (BID) | ORAL | 2 refills | Status: DC

## 2019-04-06 MED ORDER — METOPROLOL TARTRATE 25 MG PO TABS: 25.0000 mg | ORAL_TABLET | Freq: Two times a day (BID) | ORAL | 0 refills | Status: DC

## 2019-04-06 MED ORDER — ASPIRIN 81 MG PO CHEW: 81.0000 mg | CHEWABLE_TABLET | Freq: Every day | ORAL | 1 refills | Status: AC

## 2019-04-06 MED ORDER — LOSARTAN POTASSIUM 25 MG PO TABS: 25.0000 mg | ORAL_TABLET | Freq: Every day | ORAL | 1 refills | Status: DC

## 2019-04-06 MED FILL — METOPROLOL TARTRATE 25 MG T: 25 | 30 days supply | Qty: 60 | Fill #0

## 2019-04-06 MED FILL — NITROGLYCERIN 0.4 MG TAB SL: 0.4 | 8 days supply | Qty: 25 | Fill #0

## 2019-04-06 MED FILL — BRILINTA 90 MG TABLET: 90 | 30 days supply | Qty: 60 | Fill #0

## 2019-04-06 MED FILL — ATORVASTATIN CALCIUM 80 MG: 80 | 30 days supply | Qty: 30 | Fill #0

## 2019-04-06 MED FILL — LOSARTAN POTASSIUM 25 MG TA: 25 | 30 days supply | Qty: 30 | Fill #0

## 2019-04-06 MED FILL — ASPIRIN LOW DOSE 81 MG CHEW: 81 | 90 days supply | Qty: 90 | Fill #0

## 2019-04-06 MED FILL — Nitroglycerin IV Soln 100 MCG/ML in D5W: INTRA_ARTERIAL | Qty: 10 | Status: AC

## 2019-04-06 NOTE — Discharge Summary (Signed)
Discharge Summary    Patient ID: Kevin [name],  MRN: [id number], DOB/AGE: 52/12/1965 52 y.o.  Admit date: 04/05/2019 Discharge date: 04/06/2019  Primary Care Provider: Pecola LawlessHopper, William F Primary Cardiologist: Dr. Excell Seltzerooper  Discharge Diagnoses    Principal Problem:   NSTEMI (non-ST elevated myocardial infarction) [facility](HCC) Active Problems:   Hyperlipidemia   Hypertension   Pre-diabetes   Allergies No Known Allergies  Diagnostic Studies/Procedures    Cath: 04/05/19   A drug-eluting stent was successfully placed using a STENT SYNERGY DES 3X12.  Post intervention, there is a 0% residual stenosis.   1.  Multivessel coronary artery disease with critical stenosis of the proximal/ostial LAD (culprit vessel), moderate stenosis of the ostial OM branches, and moderate stenosis of the mid RCA 2.  Normal LV systolic function with no regional wall motion abnormalities and normal LVEDP 3.  Successful PCI of the proximal LAD using a 3.0 x 12 mm Synergy DES postdilated with a 3.5 mm noncompliant balloon to high pressure  Recommendations: Aspirin and ticagrelor without interruption for a minimum of 12 months.  Aggressive medical therapy for residual CAD.  Diagnostic Dominance: Right  Intervention    _____________   History of Present Illness     Kevin [name] is a 53 yo male with no PMH who had been having intermittent chest pain for about a month. At first, once a day. He moved around, no change with exertion, position change or deep inspiration. 10/10 at its worst. No SOB, N&V, diaphoresis.  No relation to meals, time of day.  The day of admission, it happened again shortly after waking, but it got worse about 10 am, and he came to the ER. He got ASA 324 mg, morphine 4 mg and GI cocktail. The meds helped but the pain is getting worse again.  He had nitroglycerin paste applied..  While being interviewed about his symptoms, his pain spiked again and he appeared acutely  uncomfortable.  ECG done at that time was not a STEMI.  However, the pain eased off in a short period of time, concerning for response to nitrates.  Before a month ago, he had never had anything like this before.  He had no history of dyspnea on exertion, or exertional chest pain.  He had no problems with lower extremity edema, orthopnea or PND.  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  However, he had not felt limited at all by shortness of breath, and exertion did not seem to bring on or worsen his chest pain.  He has no history of palpitations, presyncope or syncope.  He has not had any cough, fever, or chills.  No new over-the-counter medications, he takes vitamin C chronically to try to stay healthy.  hsT was elevated in the ED. He was admitted with plans to undergo cardiac cath.   Hospital Course     1. NSTEMI: underwent cardiac cath note above with Dr. Excell Seltzerooper noting a pLAD 95 % lesion with successful PCI/DES x1. Other moderate nonobstructive disease in the dLAD, RCA and OM branches. hsT peaked at 231. He was placed on DAPT with ASA/Brilinta with for at least one year. LV gram showed normal EF. No recurrent chest pain while ambulating with cardiac rehab.   2. HL: LDL noted at 130, and placed on high dose statin.  3. HTN: tolerated the addition of BB and ARB. Can further titrate as an outpatient. Asked him to  keep record of blood pressures at home.   4. Pre diabetes: instructed he needs to establish with PCP for long term management. Given education on diet and exercise.    General: Well developed, well nourished, male appearing in no acute distress. Head: Normocephalic, atraumatic.  Neck: Supple without bruits, JVD. Lungs:  Resp regular and unlabored, CTA. Heart: RRR, S1, S2, no S3, S4, or murmur; no rub. Abdomen: Soft, non-tender, non-distended with normoactive bowel sounds. No hepatomegaly. No rebound/guarding. No obvious  abdominal masses. Extremities: No clubbing, cyanosis, edema. Distal pedal pulses are 2+ bilaterally. R radial cath site stable without bruising or hematoma Neuro: Alert and oriented X 3. Moves all extremities spontaneously. Psych: Normal affect.  Kevin [name] was seen by Dr. Excell Seltzerooper and determined stable for discharge home. Follow up in the office has been arranged. Medications are listed below.   _____________  Discharge Vitals Blood pressure 137/84, pulse 66, temperature 97.8 F (36.6 C), temperature source Oral, resp. rate 20, height 6\' 1"  (1.854 m), weight 122 kg, SpO2 97 %.  Filed Weights   04/05/19 1044 04/06/19 0638  Weight: 122.5 kg 122 kg    Labs & Radiologic Studies    CBC Recent Labs    04/05/19 1052 04/06/19 0502  WBC 13.3* 11.6*  HGB 17.3* 15.9  HCT 50.7 46.9  MCV 86.8 86.9  PLT 267 253   Basic Metabolic Panel Recent Labs    81/19/1406/30/20 1052 04/06/19 0502  NA 138 138  K 4.3 4.2  CL 104 108  CO2 21* 20*  GLUCOSE 118* 104*  BUN 14 11  CREATININE 1.33* 1.15  CALCIUM 9.5 8.8*   Liver Function Tests Recent Labs    04/05/19 1052  AST 32  ALT 45*  ALKPHOS 90  BILITOT 0.6  PROT 7.5  ALBUMIN 4.1   Recent Labs    04/05/19 1052  LIPASE 48   Cardiac Enzymes No results for input(s): CKTOTAL, CKMB, CKMBINDEX, TROPONINI in the last 72 hours. BNP Invalid input(s): POCBNP D-Dimer No results for input(s): DDIMER in the last 72 hours. Hemoglobin A1C Recent Labs    04/05/19 2231  HGBA1C 6.4*   Fasting Lipid Panel Recent Labs    04/06/19 0502  CHOL 199  HDL 30*  LDLCALC 130*  TRIG 194*  CHOLHDL 6.6   Thyroid Function Tests Recent Labs    04/05/19 2231  TSH 2.894   _____________  Dg Chest Portable 1 View  Result Date: 04/05/2019 CLINICAL DATA:  Chest pain. EXAM: PORTABLE CHEST 1 VIEW COMPARISON:  04/22/2012 FINDINGS: The heart size and mediastinal contours are within normal limits. Both lungs are clear. The visualized skeletal  structures are unremarkable. IMPRESSION: Normal exam. Electronically Signed   By: Francene BoyersJames  Maxwell M.D.   On: 04/05/2019 11:08   Disposition   Pt is being discharged home today in good condition.  Follow-up Plans & Appointments    Follow-up Information    Berton BonHammond, Janine, NP Follow up on 04/15/2019.   Specialty: Cardiology Why: at 9:30am for your follow up appt.  Contact information: [address] [place] [address] [phone]          Discharge Instructions    Amb Referral to Cardiac Rehabilitation   Complete by: As directed    Diagnosis:  NSTEMI Coronary Stents     After initial evaluation and assessments completed: Virtual Based Care may be provided alone or in conjunction with Phase 2 Cardiac Rehab based on patient barriers.: Yes   Call MD  for:  redness, tenderness, or signs of infection (pain, swelling, redness, odor or green/yellow discharge around incision site)   Complete by: As directed    Diet - low sodium heart healthy   Complete by: As directed    Discharge instructions   Complete by: As directed    Radial Site Care Refer to this sheet in the next few weeks. These instructions provide you with information on caring for yourself after your procedure. Your caregiver may also give you more specific instructions. Your treatment has been planned according to current medical practices, but problems sometimes occur. Call your caregiver if you have any problems or questions after your procedure. HOME CARE INSTRUCTIONS You may shower the day after the procedure.Remove the bandage (dressing) and gently wash the site with plain soap and water.Gently pat the site dry.  Do not apply powder or lotion to the site.  Do not submerge the affected site in water for 3 to 5 days.  Inspect the site at least twice daily.  Do not flex or bend the affected arm for 24 hours.  No lifting over 5 pounds (2.3 kg) for 5 days after your procedure.  Do not drive home if you are  discharged the same day of the procedure. Have someone else drive you.  You may drive 24 hours after the procedure unless otherwise instructed by your caregiver.  What to expect: Any bruising will usually fade within 1 to 2 weeks.  Blood that collects in the tissue (hematoma) may be painful to the touch. It should usually decrease in size and tenderness within 1 to 2 weeks.  SEEK IMMEDIATE MEDICAL CARE IF: You have unusual pain at the radial site.  You have redness, warmth, swelling, or pain at the radial site.  You have drainage (other than a small amount of blood on the dressing).  You have chills.  You have a fever or persistent symptoms for more than 72 hours.  You have a fever and your symptoms suddenly get worse.  Your arm becomes pale, cool, tingly, or numb.  You have heavy bleeding from the site. Hold pressure on the site.   PLEASE DO NOT MISS ANY DOSES OF YOUR BRILINTA!!!!! Also keep a log of you blood pressures and bring back to your follow up appt. Please call the office with any questions.   Patients taking blood thinners should generally stay away from medicines like ibuprofen, Advil, Motrin, naproxen, and Aleve due to risk of stomach bleeding. You may take Tylenol as directed or talk to your primary doctor about alternatives.   Increase activity slowly   Complete by: As directed        Discharge Medications     Medication List    TAKE these medications   aspirin 81 MG chewable tablet Chew 1 tablet (81 mg total) by mouth daily. Start taking on: April 07, 2019   atorvastatin 80 MG tablet Commonly known as: LIPITOR Take 1 tablet (80 mg total) by mouth daily at 6 PM.   losartan 25 MG tablet Commonly known as: COZAAR Take 1 tablet (25 mg total) by mouth daily.   metoprolol tartrate 25 MG tablet Commonly known as: LOPRESSOR Take 1 tablet (25 mg total) by mouth 2 (two) times daily.   nitroGLYCERIN 0.4 MG SL tablet Commonly known as: NITROSTAT Place 1 tablet (0.4  mg total) under the tongue every 5 (five) minutes x 3 doses as needed for chest pain.   ticagrelor 90 MG Tabs tablet Commonly known  as: BRILINTA Take 1 tablet (90 mg total) by mouth 2 (two) times daily.   vitamin C 500 MG tablet Commonly known as: ASCORBIC ACID Take 500 mg by mouth 2 (two) times daily.        Acute coronary syndrome (MI, NSTEMI, STEMI, etc) this admission?: Yes.     AHA/ACC Clinical Performance & Quality Measures: 1. Aspirin prescribed? - Yes 2. ADP Receptor Inhibitor (Plavix/Clopidogrel, Brilinta/Ticagrelor or Effient/Prasugrel) prescribed (includes medically managed patients)? - Yes 3. Beta Blocker prescribed? - Yes 4. High Intensity Statin (Lipitor 40-80mg  or Crestor 20-40mg ) prescribed? - Yes 5. EF assessed during THIS hospitalization? - Yes 6. For EF <40%, was ACEI/ARB prescribed? - Yes 7. For EF <40%, Aldosterone Antagonist (Spironolactone or Eplerenone) prescribed? - Not Applicable (EF >/= 16%) 8. Cardiac Rehab Phase II ordered (Included Medically managed Patients)? - Yes      Outstanding Labs/Studies   FLP/LFTs in 6 weeks.   Duration of Discharge Encounter   Greater than 30 minutes including physician time.  Signed, Reino Bellis NP-C 04/06/2019, 1:51 PM  Patient seen, examined. Available data reviewed. Agree with findings, assessment, and plan as outlined by Reino Bellis, NP.  The physical exam documented above reflect my personal findings of this morning's examination.  The patient presented yesterday with non-STEMI and was found to have critical stenosis of the proximal LAD, treated successfully with PCI using a drug-eluting stent (3.0 x 12 mm Synergy DES).  He has done well without complication.  His troponin is trending down this morning and he has no recurrent chest pain.  He has residual moderate stenosis in the right coronary artery and left circumflex branches.  I recommended medical therapy for remaining CAD.  His telemetry is reviewed and  shows no significant arrhythmia.  He walked this morning without symptoms.  His medical program is reviewed and is appropriate.  Blood pressure is elevated and I suspect he has had longstanding untreated hypertension.  Will add losartan and continue metoprolol.  It will be important for him to follow lifestyle modification recommendations and be adherent to medical therapy.  He should remain on dual antiplatelet therapy with aspirin and ticagrelor as well as a high intensity statin drug.  Otherwise as outlined above.  The patient is stable for discharge today.  Sherren Mocha, M.D. 04/06/2019 1:51 PM

## 2019-04-06 NOTE — Plan of Care (Signed)
  Problem: Education: Goal: Understanding of CV disease, CV risk reduction, and recovery process will improve Outcome: Completed/Met Goal: Individualized Educational Video(s) Outcome: Completed/Met   Problem: Activity: Goal: Ability to return to baseline activity level will improve Outcome: Completed/Met   Problem: Cardiovascular: Goal: Ability to achieve and maintain adequate cardiovascular perfusion will improve Outcome: Completed/Met Goal: Vascular access site(s) Level 0-1 will be maintained Outcome: Completed/Met   Problem: Health Behavior/Discharge Planning: Goal: Ability to safely manage health-related needs after discharge will improve Outcome: Completed/Met   Problem: Education: Goal: Knowledge of General Education information will improve Description: Including pain rating scale, medication(s)/side effects and non-pharmacologic comfort measures Outcome: Completed/Met   Problem: Health Behavior/Discharge Planning: Goal: Ability to manage health-related needs will improve Outcome: Completed/Met   Problem: Clinical Measurements: Goal: Ability to maintain clinical measurements within normal limits will improve Outcome: Completed/Met Goal: Will remain free from infection Outcome: Completed/Met Goal: Diagnostic test results will improve Outcome: Completed/Met Goal: Respiratory complications will improve Outcome: Completed/Met Goal: Cardiovascular complication will be avoided Outcome: Completed/Met   Problem: Activity: Goal: Risk for activity intolerance will decrease Outcome: Completed/Met   Problem: Nutrition: Goal: Adequate nutrition will be maintained Outcome: Completed/Met   Problem: Coping: Goal: Level of anxiety will decrease Outcome: Completed/Met   Problem: Elimination: Goal: Will not experience complications related to bowel motility Outcome: Completed/Met Goal: Will not experience complications related to urinary retention Outcome: Completed/Met   Problem: Pain Managment: Goal: General experience of comfort will improve Outcome: Completed/Met   Problem: Safety: Goal: Ability to remain free from injury will improve Outcome: Completed/Met   Problem: Skin Integrity: Goal: Risk for impaired skin integrity will decrease Outcome: Completed/Met

## 2019-04-06 NOTE — Progress Notes (Signed)
CARDIAC REHAB PHASE I   PRE:  Rate/Rhythm: 67 SR  BP:  Supine: 154/94  Sitting:   Standing:    SaO2: 96%RA  MODE:  Ambulation: 470 ft   POST:  Rate/Rhythm: 91 SR  BP:  Supine:   Sitting: 167/87  Standing:    SaO2: 93%RA [id number] Pt walked 470 ft on RA with steady gait and no CP. Tolerated well. MI education completed with pt who voiced understanding. Stressed importance of brilinta with stent. Needs to see case manager. Reviewed NTG use, MI restrictions, ex ed, watching carbs and heart healthy food choices. Pt stated he eats out a lot. Discussed with pt that his sugars have been up last few months with A1C of 6.4. discussed CRP 2 and referred to [facility].  Pt is interested in participating in Virtual Cardiac Rehab. Pt advised that Virtual Cardiac Rehab is provided at no cost to the patient.  Checklist:  1. Pt has smart device  ie smartphone and/or ipad for downloading an app  Yes 2. Reliable internet/wifi service    Yes        May have issues when daughter moves out as she has internet 3. Understands how to use their smartphone and navigate within an app.  Yes       Graylon Good, RN BSN  04/06/2019 9:21 AM

## 2019-04-07 ENCOUNTER — Telehealth (HOSPITAL_COMMUNITY): Payer: Self-pay

## 2019-04-07 NOTE — Telephone Encounter (Signed)
Pt is interested in the Virtual cardiac rehab. Told pt that we will contact him after his chart has been reviewed. Tedra Sen[place]. Support Rep II

## 2019-04-14 ENCOUNTER — Telehealth: Payer: Self-pay | Admitting: Cardiology

## 2019-04-14 NOTE — Telephone Encounter (Signed)

## 2019-04-15 ENCOUNTER — Ambulatory Visit (INDEPENDENT_AMBULATORY_CARE_PROVIDER_SITE_OTHER): Payer: Self-pay | Admitting: Cardiology

## 2019-04-15 ENCOUNTER — Other Ambulatory Visit: Payer: Self-pay

## 2019-04-15 ENCOUNTER — Encounter: Payer: Self-pay | Admitting: Cardiology

## 2019-04-15 VITALS — BP 110/72 | HR 59 | Ht 73.0 in | Wt 277.0 lb

## 2019-04-15 DIAGNOSIS — I2510 Atherosclerotic heart disease of native coronary artery without angina pectoris: Secondary | ICD-10-CM | POA: Insufficient documentation

## 2019-04-15 DIAGNOSIS — R7303 Prediabetes: Secondary | ICD-10-CM

## 2019-04-15 DIAGNOSIS — E782 Mixed hyperlipidemia: Secondary | ICD-10-CM

## 2019-04-15 DIAGNOSIS — I10 Essential (primary) hypertension: Secondary | ICD-10-CM

## 2019-04-15 NOTE — Patient Instructions (Addendum)
Medication Instructions:  Your physician recommends that you continue on your current medications as directed. Please refer to the Current Medication list given to you today.  If you need a refill on your cardiac medications before your next appointment, please call your pharmacy.   Lab work: TO BE DONE IN 6 WEEKS: LFTS, LIPIDS If you have labs (blood work) drawn today and your tests are completely normal, you will receive your results only by: Marland Kitchen. MyChart Message (if you have MyChart) OR . A paper copy in the mail If you have any lab test that is abnormal or we need to change your treatment, we will call you to review the results.  Testing/Procedures: NONE  Follow-Up: At [facility], you and your health needs are our priority.  As part of our continuing mission to provide you with exceptional heart care, we have created designated Provider Care Teams.  These Care Teams include your primary Cardiologist (physician) and Advanced Practice Providers (APPs -  Physician Assistants and Nurse Practitioners) who all work together to provide you with the care you need, when you need it. You will need a follow up appointment in:  6 months.  Please call our office 2 months in advance to schedule this appointment.  You may see Tonny BollmanMichael Cooper, MD or one of the following Advanced Practice Providers on your designated Care Team: Tereso NewcomerScott Weaver, PA-C [place] HallBhagat, [place]PA-C . Berton BonJanine Luma Clopper, NP  Any Other Special Instructions Will Be Listed Below (If Applicable).   =================================================================   Lifestyle Modifications to Prevent and Treat Heart Disease -Recommend heart healthy/Mediterranean diet, with whole grains, fruits, vegetable, fish, lean meats, nuts, and olive oil.  -Limit salt. -Recommend moderate walking, 3-5 times/week for 30-50 minutes each session. Aim for at least 150 minutes.week. Goal should be pace of 3 miles/hours, or walking 1.5 miles in 30 minutes  -Recommend avoidance of tobacco products. Avoid excess alcohol. -Keep blood pressure well controlled, ideally less than 130/80.   ===================================================================  Mediterranean Diet A Mediterranean diet refers to food and lifestyle choices that are based on the traditions of countries located on the [place]. This way of eating has been shown to help prevent certain conditions and improve outcomes for people who have chronic diseases, like kidney disease and heart disease. What are tips for following this plan? Lifestyle  Cook and eat meals together with your family, when possible.  Drink enough fluid to keep your urine clear or pale yellow.  Be physically active every day. This includes: ? Aerobic exercise like running or swimming. ? Leisure activities like gardening, walking, or housework.  Get 7-8 hours of sleep each night.  If recommended by your health care provider, drink red wine in moderation. This means 1 glass a day for nonpregnant women and 2 glasses a day for men. A glass of wine equals 5 oz (150 mL). Reading food labels   Check the serving size of packaged foods. For foods such as rice and pasta, the serving size refers to the amount of cooked product, not dry.  Check the total fat in packaged foods. Avoid foods that have saturated fat or trans fats.  Check the ingredients list for added sugars, such as corn syrup. Shopping  At the grocery store, buy most of your food from the areas near the walls of the store. This includes: ? Fresh fruits and vegetables (produce). ? Grains, beans, nuts, and seeds. Some of these may be available in unpackaged forms or large amounts (in bulk). ? Fresh  seafood. ? Poultry and eggs. ? Low-fat dairy products.  Buy whole ingredients instead of prepackaged foods.  Buy fresh fruits and vegetables in-season from local farmers markets.  Buy frozen fruits and vegetables in resealable bags.   If you do not have access to quality fresh seafood, buy precooked frozen shrimp or canned fish, such as tuna, salmon, or sardines.  Buy small amounts of raw or cooked vegetables, salads, or olives from the deli or salad bar at your store.  Stock your pantry so you always have certain foods on hand, such as olive oil, canned tuna, canned tomatoes, rice, pasta, and beans. Cooking  Cook foods with extra-virgin olive oil instead of using butter or other vegetable oils.  Have meat as a side dish, and have vegetables or grains as your main dish. This means having meat in small portions or adding small amounts of meat to foods like pasta or stew.  Use beans or vegetables instead of meat in common dishes like chili or lasagna.  Experiment with different cooking methods. Try roasting or broiling vegetables instead of steaming or sauteing them.  Add frozen vegetables to soups, stews, pasta, or rice.  Add nuts or seeds for added healthy fat at each meal. You can add these to yogurt, salads, or vegetable dishes.  Marinate fish or vegetables using olive oil, lemon juice, garlic, and fresh herbs. Meal planning   Plan to eat 1 vegetarian meal one day each week. Try to work up to 2 vegetarian meals, if possible.  Eat seafood 2 or more times a week.  Have healthy snacks readily available, such as: ? Vegetable sticks with hummus. ? [place] yogurt. ? Fruit and nut trail mix.  Eat balanced meals throughout the week. This includes: ? Fruit: 2-3 servings a day ? Vegetables: 4-5 servings a day ? Low-fat dairy: 2 servings a day ? Fish, poultry, or lean meat: 1 serving a day ? Beans and legumes: 2 or more servings a week ? Nuts and seeds: 1-2 servings a day ? Whole grains: 6-8 servings a day ? Extra-virgin olive oil: 3-4 servings a day  Limit red meat and sweets to only a few servings a month What are my food choices?  Mediterranean diet ? Recommended  Grains: Whole-grain pasta. Brown rice.  [place] wheat. Polenta. Couscous. Whole-wheat bread. [place].  Vegetables: Artichokes. Beets. Broccoli. Cabbage. Carrots. Eggplant. Green beans. Chard. Kale. Spinach. Onions. Leeks. Peas. Squash. Tomatoes. Peppers. Radishes.  Fruits: Apples. Apricots. Avocado. Berries. Bananas. Cherries. Dates. Figs. Grapes. Lemons. Melon. Oranges. Peaches. Plums. Pomegranate.  Meats and other protein foods: Beans. Almonds. Sunflower seeds. Pine nuts. Peanuts. [place]. Salmon. Scallops. Shrimp. [place]. Tilapia. Clams. Oysters. Eggs.  Dairy: Low-fat milk. Cheese. Greek yogurt.  Beverages: Water. Red wine. Herbal tea.  Fats and oils: Extra virgin olive oil. Avocado oil. Grape seed oil.  Sweets and desserts: [place] yogurt with honey. Baked apples. Poached pears. Trail mix.  Seasoning and other foods: Basil. Cilantro. Coriander. Cumin. Mint. Parsley. Sage. Rosemary. Tarragon. Garlic. Oregano. Thyme. Pepper. Balsalmic vinegar. Tahini. Hummus. Tomato sauce. Olives. Mushrooms. ? Limit these  Grains: Prepackaged pasta or rice dishes. Prepackaged cereal with added sugar.  Vegetables: Deep fried potatoes (french fries).  Fruits: Fruit canned in syrup.  Meats and other protein foods: Beef. Pork. Lamb. Poultry with skin. Hot dogs. Berniece Salines.  Dairy: Ice cream. Sour cream. Whole milk.  Beverages: Juice. Sugar-sweetened soft drinks. Beer. Liquor and spirits.  Fats and oils: Butter. Canola oil. Vegetable oil. Beef fat (tallow). Lard.  Sweets and desserts: Cookies. Cakes. Pies. Candy.  Seasoning and other foods: Mayonnaise. Premade sauces and marinades. The items listed may not be a complete list. Talk with your dietitian about what dietary choices are right for you. Summary  The Mediterranean diet includes both food and lifestyle choices.  Eat a variety of fresh fruits and vegetables, beans, nuts, seeds, and whole grains.  Limit the amount of red meat and sweets that you eat.  Talk with your health care  provider about whether it is safe for you to drink red wine in moderation. This means 1 glass a day for nonpregnant women and 2 glasses a day for men. A glass of wine equals 5 oz (150 mL). This information is not intended to replace advice given to you by your health care provider. Make sure you discuss any questions you have with your health care provider. Document Released: 05/15/2016 Document Revised: 05/22/2016 Document Reviewed: 05/15/2016 Elsevier Patient Education  2020 [employer]

## 2019-04-15 NOTE — Progress Notes (Signed)
Cardiology Office Note:    Date:  04/15/2019   ID:  Kevin Skyeaymond L Yost, DOB 04/16/1966, MRN [id number]  PCP:  Pecola LawlessHopper, William F, MD  Cardiologist:  Tonny BollmanMichael Cooper, MD  Referring MD: Pecola LawlessHopper, William F, MD   Chief Complaint  Patient presents with   Hospitalization Follow-up    NSTEMI, PCI    History of Present Illness:    Kevin [name] is a 53 y.o. male with no past medical history who was admitted to the hospital on 04/05/2019 with NSTEMI. He was taking no medications except for vitamin C for health prior to admission.   High-sensitivity troponin was elevated at 231.  TSH was in normal range.  Hemoglobin A1c 6.4.  LDL cholesterol was 130.  He was taken for cardiac catheterization on 04/05/2019 revealing critical stenosis of the proximal/ostial LAD, moderate stenosis of the ostial OM branches and a moderate stenosis in the mid RCA.  He underwent successful PCI of the proximal LAD with synergy drug-eluting stent.  LV gram showed normal EF.  He was started on aspirin and ticagrelor with plans to continue uninterrupted for 12 months.  He was started on high intensity statin.  The patient was noted to be hypertensive.  He was started on beta-blocker and ARB to be further titrated as an outpatient according to blood pressure.  The patient was noted to have prediabetes and was instructed to establish with a PCP for long-term management.  Kevin [name] is here today alone for hosptial follow up. He is feeling "a whole lot better". He is working on building up his walking, now up to 12 minutes 2X per day. No chest pain or shortness of breath. No orthopnea, edema, lightheadedness. He is working on his diet, adding salads, fish, chicken. Trying to stay away from fast foods and processed foods. He drinks mostly water. He is tolerating all of his meds. Right wrist cath site is healing well.   Past Medical History:  Diagnosis Date   Coronary artery disease    a. 04/05/19 NSTEMI, DES to pLAD,     Hyperlipidemia LDL goal <70    Hypertension    NSTEMI (non-ST elevated myocardial infarction) (HCC) 04/05/2019   Pre-diabetes     Past Surgical History:  Procedure Laterality Date   CORONARY STENT INTERVENTION N/A 04/05/2019   Procedure: CORONARY STENT INTERVENTION;  Surgeon: Tonny Bollmanooper, Michael, MD;  [facility];  Service: Cardiovascular;  Laterality: N/A;   LEFT HEART CATH AND CORONARY ANGIOGRAPHY N/A 04/05/2019   Procedure: LEFT HEART CATH AND CORONARY ANGIOGRAPHY;  Surgeon: Tonny Bollmanooper, Michael, MD;  [facility];  Service: Cardiovascular;  Laterality: N/A;    Current Medications: Current Meds  Medication Sig   aspirin 81 MG chewable tablet Chew 1 tablet (81 mg total) by mouth daily.   atorvastatin (LIPITOR) 80 MG tablet Take 1 tablet (80 mg total) by mouth daily at 6 PM.   losartan (COZAAR) 25 MG tablet Take 1 tablet (25 mg total) by mouth daily.   metoprolol tartrate (LOPRESSOR) 25 MG tablet Take 1 tablet (25 mg total) by mouth 2 (two) times daily.   nitroGLYCERIN (NITROSTAT) 0.4 MG SL tablet Place 1 tablet (0.4 mg total) under the tongue every 5 (five) minutes x 3 doses as needed for chest pain.   ticagrelor (BRILINTA) 90 MG TABS tablet Take 1 tablet (90 mg total) by mouth 2 (two) times daily.   vitamin C (ASCORBIC ACID) 500 MG tablet Take 500 mg by mouth 2 (two)  times daily.     Allergies:   Patient has no known allergies.   Social History   Socioeconomic History   Marital status: Single    Spouse name: Not on file   Number of children: Not on file   Years of education: Not on file   Highest education level: Not on file  Occupational History   Occupation: plumber's helper  Social Needs   Financial resource strain: Not on file   Food insecurity    Worry: Not on file    Inability: Not on file   Transportation needs    Medical: Not on file    Non-medical: Not on file  Tobacco Use   Smoking status: Former Smoker    Types:  Cigarettes    Quit date: 10/06/2006    Years since quitting: 12.5   Smokeless tobacco: Never Used  Substance and Sexual Activity   Alcohol use: Not Currently    Comment:     Drug use: No   Sexual activity: Not on file  Lifestyle   Physical activity    Days per week: Not on file    Minutes per session: Not on file   Stress: Not on file  Relationships   Social connections    Talks on phone: Not on file    Gets together: Not on file    Attends religious service: Not on file    Active member of club or organization: Not on file    Attends meetings of clubs or organizations: Not on file    Relationship status: Not on file  Other Topics Concern   Not on file  Social History Narrative   Lives alone in [place]     Family History: The patient's family history includes Bladder Cancer in his father; CAD in his maternal grandfather and paternal grandmother; Diabetes in his father, maternal grandmother, and paternal grandmother; Hyperlipidemia in his mother; Hypertension in his father, maternal grandfather, and maternal grandmother; Kidney disease in his maternal grandmother. ROS:   Please see the history of present illness.     All other systems reviewed and are negative.  EKGs/Labs/Other Studies Reviewed:    The following studies were reviewed today:  Cath: 04/05/19   A drug-eluting stent was successfully placed using a STENT SYNERGY DES 3X12.  Post intervention, there is a 0% residual stenosis.  1. Multivessel coronary artery disease with critical stenosis of the proximal/ostial LAD (culprit vessel), moderate stenosis of the ostial OM branches, and moderate stenosis of the mid RCA 2. Normal LV systolic function with no regional wall motion abnormalities and normal LVEDP 3. Successful PCI of the proximal LAD using a 3.0 x 12 mm Synergy DES postdilated with a 3.5 mm noncompliant balloon to high pressure  Recommendations: Aspirin and ticagrelor without interruption  for a minimum of 12 months. Aggressive medical therapy for residual CAD.  Diagnostic Dominance: Right  Intervention     EKG:  EKG is not ordered today.    Recent Labs: 04/05/2019: ALT 45; TSH 2.894 04/06/2019: BUN 11; Creatinine, Ser 1.15; Hemoglobin 15.9; Platelets 253; Potassium 4.2; Sodium 138   Recent Lipid Panel    Component Value Date/Time   CHOL 199 04/06/2019 0502   TRIG 194 (H) 04/06/2019 0502   HDL 30 (L) 04/06/2019 0502   CHOLHDL 6.6 04/06/2019 0502   VLDL 39 04/06/2019 0502   LDLCALC 130 (H) 04/06/2019 0502    Physical Exam:    VS:  BP 110/72    Pulse (!) 59  Ht  (1.854 m)    Wt 277 lb (125.6 kg)    SpO2 98%    BMI 36.55 kg/m     Wt Readings from Last 3 Encounters:  04/15/19 277 lb (125.6 kg)  04/06/19 268 lb 15.4 oz (122 kg)     Physical Exam  Constitutional: He is oriented to person, place, and time. He appears well-developed and well-nourished. No distress.  HENT:  Head: Normocephalic and atraumatic.  Neck: Normal range of motion. Neck supple. No JVD present.  Cardiovascular: Normal rate, regular rhythm, normal heart sounds and intact distal pulses. Exam reveals no gallop and no friction rub.  No murmur heard. Pulmonary/Chest: Effort normal and breath sounds normal. No respiratory distress. He has no wheezes. He has no rales.  Abdominal: Soft. Bowel sounds are normal.  Musculoskeletal:        General: No edema.  Neurological: He is alert and oriented to person, place, and time.  Skin: Skin is warm and dry.  Psychiatric: He has a normal mood and affect. His behavior is normal. Judgment and thought content normal.  Vitals reviewed.   ASSESSMENT:    1. Coronary artery disease involving native coronary artery of native heart without angina pectoris   2. Essential hypertension   3. Mixed hyperlipidemia   4. Pre-diabetes    PLAN:    In order of problems listed above:  CAD-newly diagnosed The patient presented to the hospital with  NSTEMI 04/05/19.  DES placed for critical stenosis of proximal LAD with residual disease of the RCA chest.  Ticagrelor, aspirin and high intensity statin. Tolerating meds well. I recommend cardiac rehab phase 2 once OK with pandemic. Continue working on diet and exercise.   Hypertension Recently started on losartan 25 mg daily and metoprolol 25 mg twice daily. BP is well controlled. Continue current therapy.   Hyperlipidemia, goal LDL <70 LDL 130.  Patient started on high intensity statin with atorvastatin 80 mg.  Will recheck lipid panel and LFTs in 6 weeks  Diabetes type 2 Newly diagnosed in the hospital with A1c 6.4.  Patient will need to establish with a PCP for management. I discussed this with him and we provided resources to find PCP.   Medication Adjustments/Labs and Tests Ordered: Current medicines are reviewed at length with the patient today.  Concerns regarding medicines are outlined above. Labs and tests ordered and medication changes are outlined in the patient instructions below:  Patient Instructions  Medication Instructions:  Your physician recommends that you continue on your current medications as directed. Please refer to the Current Medication list given to you today.  If you need a refill on your cardiac medications before your next appointment, please call your pharmacy.   Lab work: TO BE DONE IN 6 WEEKS: LFTS, LIPIDS If you have labs (blood work) drawn today and your tests are completely normal, you will receive your results only by:  MyChart Message (if you have MyChart) OR  A paper copy in the mail If you have any lab test that is abnormal or we need to change your treatment, we will call you to review the results.  Testing/Procedures: NONE  Follow-Up: At [facility], you and your health needs are our priority.  As part of our continuing mission to provide you with exceptional heart care, we have created designated Provider Care Teams.  These Care Teams  include your primary Cardiologist (physician) and Advanced Practice Providers (APPs -  Physician Assistants and Nurse Practitioners) who all work  together to provide you with the care you need, when you need it. You will need a follow up appointment in:  6 months.  Please call our office 2 months in advance to schedule this appointment.  You may see Tonny BollmanMichael Cooper, MD or one of the following Advanced Practice Providers on your designated Care Team: Tereso NewcomerScott Weaver, PA-C Vin BroadwaterBhagat, [place] JerseyPA-C  Berton BonJanine Aldena Worm, NP  Any Other Special Instructions Will Be Listed Below (If Applicable).   =================================================================   Lifestyle Modifications to Prevent and Treat Heart Disease -Recommend heart healthy/Mediterranean diet, with whole grains, fruits, vegetable, fish, lean meats, nuts, and olive oil.  -Limit salt. -Recommend moderate walking, 3-5 times/week for 30-50 minutes each session. Aim for at least 150 minutes.week. Goal should be pace of 3 miles/hours, or walking 1.5 miles in 30 minutes -Recommend avoidance of tobacco products. Avoid excess alcohol. -Keep blood pressure well controlled, ideally less than 130/80.   ===================================================================  Mediterranean Diet A Mediterranean diet refers to food and lifestyle choices that are based on the traditions of countries located on the [place]. This way of eating has been shown to help prevent certain conditions and improve outcomes for people who have chronic diseases, like kidney disease and heart disease. What are tips for following this plan? Lifestyle  Cook and eat meals together with your family, when possible.  Drink enough fluid to keep your urine clear or pale yellow.  Be physically active every day. This includes: ? Aerobic exercise like running or swimming. ? Leisure activities like gardening, walking, or housework.  Get 7-8 hours of sleep each  night.  If recommended by your health care provider, drink red wine in moderation. This means 1 glass a day for nonpregnant women and 2 glasses a day for men. A glass of wine equals 5 oz (150 mL). Reading food labels   Check the serving size of packaged foods. For foods such as rice and pasta, the serving size refers to the amount of cooked product, not dry.  Check the total fat in packaged foods. Avoid foods that have saturated fat or trans fats.  Check the ingredients list for added sugars, such as corn syrup. Shopping  At the grocery store, buy most of your food from the areas near the walls of the store. This includes: ? Fresh fruits and vegetables (produce). ? Grains, beans, nuts, and seeds. Some of these may be available in unpackaged forms or large amounts (in bulk). ? Fresh seafood. ? Poultry and eggs. ? Low-fat dairy products.  Buy whole ingredients instead of prepackaged foods.  Buy fresh fruits and vegetables in-season from local farmers markets.  Buy frozen fruits and vegetables in resealable bags.  If you do not have access to quality fresh seafood, buy precooked frozen shrimp or canned fish, such as tuna, salmon, or sardines.  Buy small amounts of raw or cooked vegetables, salads, or olives from the deli or salad bar at your store.  Stock your pantry so you always have certain foods on hand, such as olive oil, canned tuna, canned tomatoes, rice, pasta, and beans. Cooking  Cook foods with extra-virgin olive oil instead of using butter or other vegetable oils.  Have meat as a side dish, and have vegetables or grains as your main dish. This means having meat in small portions or adding small amounts of meat to foods like pasta or stew.  Use beans or vegetables instead of meat in common dishes like chili or lasagna.  Experiment with different cooking  methods. Try roasting or broiling vegetables instead of steaming or sauteing them.  Add frozen vegetables to soups,  stews, pasta, or rice.  Add nuts or seeds for added healthy fat at each meal. You can add these to yogurt, salads, or vegetable dishes.  Marinate fish or vegetables using olive oil, lemon juice, garlic, and fresh herbs. Meal planning   Plan to eat 1 vegetarian meal one day each week. Try to work up to 2 vegetarian meals, if possible.  Eat seafood 2 or more times a week.  Have healthy snacks readily available, such as: ? Vegetable sticks with hummus. ? [place] yogurt. ? Fruit and nut trail mix.  Eat balanced meals throughout the week. This includes: ? Fruit: 2-3 servings a day ? Vegetables: 4-5 servings a day ? Low-fat dairy: 2 servings a day ? Fish, poultry, or lean meat: 1 serving a day ? Beans and legumes: 2 or more servings a week ? Nuts and seeds: 1-2 servings a day ? Whole grains: 6-8 servings a day ? Extra-virgin olive oil: 3-4 servings a day  Limit red meat and sweets to only a few servings a month What are my food choices?  Mediterranean diet ? Recommended  Grains: Whole-grain pasta. Brown rice. Bulgar wheat. Polenta. Couscous. Whole-wheat bread. Orpah Cobbatmeal. Quinoa.  Vegetables: Artichokes. Beets. Broccoli. Cabbage. Carrots. Eggplant. Green beans. Chard. Kale. Spinach. Onions. Leeks. Peas. Squash. Tomatoes. Peppers. Radishes.  Fruits: Apples. Apricots. Avocado. Berries. Bananas. Cherries. Dates. Figs. Grapes. Lemons. Melon. Oranges. Peaches. Plums. Pomegranate.  Meats and other protein foods: Beans. Almonds. Sunflower seeds. Pine nuts. Peanuts. Cod. Salmon. Scallops. Shrimp. Tuna. Tilapia. Clams. Oysters. Eggs.  Dairy: Low-fat milk. Cheese. Greek yogurt.  Beverages: Water. Red wine. Herbal tea.  Fats and oils: Extra virgin olive oil. Avocado oil. Grape seed oil.  Sweets and desserts: [place] yogurt with honey. Baked apples. Poached pears. Trail mix.  Seasoning and other foods: Basil. Cilantro. Coriander. Cumin. Mint. Parsley. Sage. Rosemary. Tarragon. Garlic.  Oregano. Thyme. Pepper. Balsalmic vinegar. Tahini. Hummus. Tomato sauce. Olives. Mushrooms. ? Limit these  Grains: Prepackaged pasta or rice dishes. Prepackaged cereal with added sugar.  Vegetables: Deep fried potatoes (french fries).  Fruits: Fruit canned in syrup.  Meats and other protein foods: Beef. Pork. Lamb. Poultry with skin. Hot dogs. Tomasa BlaseBacon.  Dairy: Ice cream. Sour cream. Whole milk.  Beverages: Juice. Sugar-sweetened soft drinks. Beer. Liquor and spirits.  Fats and oils: Butter. Canola oil. Vegetable oil. Beef fat (tallow). Lard.  Sweets and desserts: Cookies. Cakes. Pies. Candy.  Seasoning and other foods: Mayonnaise. Premade sauces and marinades. The items listed may not be a complete list. Talk with your dietitian about what dietary choices are right for you. Summary  The Mediterranean diet includes both food and lifestyle choices.  Eat a variety of fresh fruits and vegetables, beans, nuts, seeds, and whole grains.  Limit the amount of red meat and sweets that you eat.  Talk with your health care provider about whether it is safe for you to drink red wine in moderation. This means 1 glass a day for nonpregnant women and 2 glasses a day for men. A glass of wine equals 5 oz (150 mL). This information is not intended to replace advice given to you by your health care provider. Make sure you discuss any questions you have with your health care provider. Document Released: 05/15/2016 Document Revised: 05/22/2016 Document Reviewed: 05/15/2016 Elsevier Patient Education  2020 [employer]      Signed, Berton BonJanine Daquann Merriott, NP  04/15/2019 7:18 PM     [facility]

## 2019-04-18 ENCOUNTER — Encounter (HOSPITAL_COMMUNITY)
Admission: RE | Admit: 2019-04-18 | Discharge: 2019-04-18 | Disposition: A | Discharge status: Home / Self Care | Payer: MEDICAID | Source: Ambulatory Visit | Attending: Cardiovascular Disease | Admitting: Cardiovascular Disease

## 2019-04-18 ENCOUNTER — Other Ambulatory Visit: Payer: Self-pay

## 2019-04-18 NOTE — Telephone Encounter (Signed)
Called and spoke with pt in regards to CR, pt stated that he does not have insurance at this time. Offer pt our Virtual Cardiac Rehab program and signed him up.

## 2019-04-18 NOTE — Telephone Encounter (Signed)

## 2019-04-18 NOTE — Progress Notes (Signed)
Called and spoke to pt regarding Virtual Cardiac Rehab.  Pt  was able to download the [facility] app on their smart device with no issues. Pt set up their account and received the following welcome message -"Welcome to the [facility] program. We hope that you will find the exercise program beneficial in your recovery process. Our staff is available to assist with any questions/concerns about your exercise routine. Best wishes". Brief orientation provided to with the advisement to watch the "Intro to Rehab" series located under the Resource tab. Pt verbalized understanding. Will continue to follow and monitor pt progress with feedback as needed.Carlette Carlton RN, BSN Cardiac and Pulmonary Rehab Nurse Navigator   

## 2019-04-18 NOTE — Telephone Encounter (Signed)
Pt is interested in participating in Virtual Cardiac Rehab. Pt advised that Virtual Cardiac Rehab is provided at no cost to the patient.  Checklist:  1. Pt has smart device  ie smartphone and/or ipad for downloading an app  Yes 2. Reliable internet/wifi service    Yes 3. Understands how to use their smartphone and navigate within an app.  Yes   Reviewed with pt the scheduling process for virtual cardiac rehab.  Pt verbalized understanding. 

## 2019-05-02 ENCOUNTER — Other Ambulatory Visit: Payer: Self-pay

## 2019-05-02 ENCOUNTER — Encounter: Payer: Self-pay | Admitting: Cardiovascular Disease

## 2019-05-02 MED ORDER — CLOPIDOGREL BISULFATE 75 MG PO TABS: 75.0000 mg | ORAL_TABLET | Freq: Every day | ORAL | 3 refills | Status: DC

## 2019-05-02 MED FILL — LOSARTAN POTASSIUM 25 MG TA: 25 | 30 days supply | Qty: 30 | Fill #0

## 2019-05-02 MED FILL — ATORVASTATIN 80 MG TABLET: 80 | 30 days supply | Qty: 30 | Fill #0

## 2019-05-02 MED FILL — METOPROLOL TARTRATE 25 MG T: 25 | 30 days supply | Qty: 60 | Fill #0

## 2019-05-02 NOTE — Progress Notes (Signed)
The patient is contacted [place] because he cannot afford Brilinta.  I have recommended that he change to clopidogrel 75 mg once daily.  I recommended that he take a 300 mg loading dose of clopidogrel the morning after his last Brilinta dose, followed by clopidogrel 75 mg daily thereafter.  He requested that the prescription be called into the [facility] outpatient pharmacy.

## 2019-05-02 NOTE — Progress Notes (Signed)
Confirmed with patient he will [place] when he runs out and START PLAVIX. The first dose, he will take Plavix 300 mg. Then he will decrease to 75 mg once daily after that. He was grateful for call and agrees with treatment plan.

## 2019-05-02 NOTE — Telephone Encounter (Signed)
Staff had messaged pt yesterday with information on applying for Brilinta patient assistance. Reviewed this information with patient. He states he is applying for medicaid and reiterates that prefer another cheaper medication than brilinta, pls advise, tx

## 2019-05-03 MED FILL — CLOPIDOGREL 75 MG TABLET: 75 | 90 days supply | Qty: 90 | Fill #0

## 2019-05-17 ENCOUNTER — Telehealth (HOSPITAL_COMMUNITY): Payer: Self-pay

## 2019-05-17 ENCOUNTER — Encounter (HOSPITAL_COMMUNITY)
Admission: RE | Admit: 2019-05-17 | Discharge: 2019-05-17 | Disposition: A | Discharge status: Home / Self Care | Payer: Self-pay | Source: Ambulatory Visit | Attending: Cardiovascular Disease | Admitting: Cardiovascular Disease

## 2019-05-17 NOTE — Telephone Encounter (Signed)
Phone call to Pt to check in and see how the virtual CR app was going. Pt has been logging exercise and vital signs each week. Pt stated he is still using his treadmill and doing some walking outside as well. Pt stated he would like to know if he could donate blood plasma again. This is something he was doing prior to his event. Encouraged Pt to contact his PCP to get clearance before donating again. Pt stated this was his only source of income and would like to resume doing so once a week.

## 2019-05-23 ENCOUNTER — Other Ambulatory Visit: Payer: Self-pay | Admitting: *Deleted

## 2019-05-23 ENCOUNTER — Other Ambulatory Visit: Payer: Self-pay

## 2019-05-23 DIAGNOSIS — I10 Essential (primary) hypertension: Secondary | ICD-10-CM

## 2019-05-23 DIAGNOSIS — E782 Mixed hyperlipidemia: Secondary | ICD-10-CM

## 2019-05-23 DIAGNOSIS — I2510 Atherosclerotic heart disease of native coronary artery without angina pectoris: Secondary | ICD-10-CM

## 2019-05-23 DIAGNOSIS — R7303 Prediabetes: Secondary | ICD-10-CM

## 2019-05-23 LAB — LIPID PANEL
Chol/HDL Ratio: 3.3 ratio (ref 0.0–5.0)
Cholesterol, Total: 97 mg/dL — ABNORMAL LOW (ref 100–199)
HDL: 29 mg/dL — ABNORMAL LOW (ref >39)
LDL Calculated: 48 mg/dL (ref 0–99)
Triglycerides: 100 mg/dL (ref 0–149)
VLDL Cholesterol Cal: 20 mg/dL (ref 5–40)

## 2019-05-23 LAB — HEPATIC FUNCTION PANEL
ALT: 72 IU/L — ABNORMAL HIGH (ref 0–44)
AST: 37 IU/L (ref 0–40)
Albumin: 4.5 g/dL (ref 3.8–4.9)
Alkaline Phosphatase: 118 IU/L — ABNORMAL HIGH (ref 39–117)
Bilirubin Total: 0.5 mg/dL (ref 0.0–1.2)
Bilirubin, Direct: 0.15 mg/dL (ref 0.00–0.40)
Total Protein: 6.7 g/dL (ref 6.0–8.5)

## 2019-06-02 MED FILL — METOPROLOL TARTRATE 25 MG T: 25 | 30 days supply | Qty: 60 | Fill #1

## 2019-06-02 MED FILL — LOSARTAN POTASSIUM 25 MG TA: 25 | 30 days supply | Qty: 30 | Fill #1

## 2019-06-02 MED FILL — ATORVASTATIN 80 MG TABLET: 80 | 30 days supply | Qty: 30 | Fill #1

## 2019-06-08 ENCOUNTER — Telehealth: Payer: Self-pay

## 2019-06-08 DIAGNOSIS — E782 Mixed hyperlipidemia: Secondary | ICD-10-CM

## 2019-06-08 NOTE — Telephone Encounter (Signed)
Pt is scheduled to come in for labs on 9/17/220. Pt is aware

## 2019-06-08 NOTE — Telephone Encounter (Signed)
-----   Message from Daune Perch, NP sent at 06/07/2019  7:50 AM EDT ----- Please arrange for LFTs on or about 06/23/19.  Thanks, Gae Bon

## 2019-06-23 ENCOUNTER — Other Ambulatory Visit: Payer: Self-pay | Admitting: *Deleted

## 2019-06-23 ENCOUNTER — Other Ambulatory Visit: Payer: Self-pay

## 2019-06-23 DIAGNOSIS — E782 Mixed hyperlipidemia: Secondary | ICD-10-CM

## 2019-06-23 LAB — HEPATIC FUNCTION PANEL
ALT: 58 IU/L — ABNORMAL HIGH (ref 0–44)
AST: 28 IU/L (ref 0–40)
Albumin: 4.6 g/dL (ref 3.8–4.9)
Alkaline Phosphatase: 115 IU/L (ref 39–117)
Bilirubin Total: 0.4 mg/dL (ref 0.0–1.2)
Bilirubin, Direct: 0.13 mg/dL (ref 0.00–0.40)
Total Protein: 7.3 g/dL (ref 6.0–8.5)

## 2019-07-01 ENCOUNTER — Other Ambulatory Visit: Payer: Self-pay | Admitting: Cardiology

## 2019-07-01 MED FILL — LOSARTAN POTASSIUM 25 MG TA: 25 | 30 days supply | Qty: 30 | Fill #2

## 2019-07-01 MED FILL — ATORVASTATIN 80 MG TABLET: 80 | 30 days supply | Qty: 30 | Fill #2

## 2019-07-04 MED FILL — METOPROLOL TARTRATE 25 MG T: 25 | 90 days supply | Qty: 180 | Fill #0

## 2019-07-05 ENCOUNTER — Encounter (HOSPITAL_COMMUNITY)
Admission: RE | Admit: 2019-07-05 | Discharge: 2019-07-05 | Disposition: A | Discharge status: Home / Self Care | Payer: Self-pay | Source: Ambulatory Visit | Attending: Cardiovascular Disease | Admitting: Cardiovascular Disease

## 2019-07-05 ENCOUNTER — Telehealth (HOSPITAL_COMMUNITY): Payer: Self-pay

## 2019-07-05 NOTE — Telephone Encounter (Signed)
Phone call to Pt to extend use of virtual CR app until the end of the year. Pt stated he would like to continue to use the app until the end of the year. Pt will have access to usethe app until 10/06/19.

## 2019-08-03 MED FILL — LOSARTAN POTASSIUM 25 MG TA: 25 | 30 days supply | Qty: 30 | Fill #3

## 2019-08-03 MED FILL — ATORVASTATIN 80 MG TABLET: 80 | 30 days supply | Qty: 30 | Fill #3

## 2019-08-03 MED FILL — CLOPIDOGREL 75 MG TABLET: 75 | 90 days supply | Qty: 90 | Fill #1

## 2019-09-08 MED FILL — ATORVASTATIN 80 MG TABLET: 80 | 30 days supply | Qty: 30 | Fill #4

## 2019-09-08 MED FILL — LOSARTAN POTASSIUM 25 MG TA: 25 | 30 days supply | Qty: 30 | Fill #4

## 2019-10-06 ENCOUNTER — Encounter (HOSPITAL_COMMUNITY): Payer: Self-pay

## 2019-10-06 NOTE — Progress Notes (Signed)
Kevin [name] has completed the virtual cardiac rehab program and will continue exercise independently at this time. Patient's virtual cardiac rehab report will be scanned to media for review.  Kevin [name] BS, ACSM CEP 10/06/2019 2:01 PM

## 2019-10-11 ENCOUNTER — Other Ambulatory Visit: Payer: Self-pay | Admitting: Cardiology

## 2019-10-11 ENCOUNTER — Other Ambulatory Visit: Payer: Self-pay | Admitting: Cardiovascular Disease

## 2019-10-11 NOTE — Progress Notes (Signed)
Cardiology Office Note:    Date:  10/12/2019   ID:  Kevin [name], DOB 09-10-1966, MRN [id number]  PCP:  Patient, No Pcp Per  Cardiologist:  Tonny Bollman, MD  Electrophysiologist:  None   Referring MD: No ref. provider found   Chief Complaint  Patient presents with  . Follow-up    CAD    History of Present Illness:    Kevin [name] is a 54 y.o. male with:   Coronary artery disease   S/p NSTEMI 03/2019 >> PCI:  DES to pLAD  Hyperlipidemia   Hypertension   Prediabetes   [name] was last seen July 2020.  He returns for follow-up.  He is here alone.  Overall, he is doing well.  He has tried to remain active.  It has been harder to walk in the colder weather.  He has not had any chest discomfort, shortness of breath, orthopnea, leg swelling, dizziness.    Prior CV studies:   The following studies were reviewed today:  Cardiac catheterization 04/05/2019  LAD ost 95, mid 30, dist 50 OM1 50, OM2 70 RCA prox 50 EF 65 PCI:  3 x 12 Synergy DES to ost LAD   Past Medical History:  Diagnosis Date  . Coronary artery disease    a. 04/05/19 NSTEMI, DES to pLAD,   . Hyperlipidemia LDL goal <70   . Hypertension   . NSTEMI (non-ST elevated myocardial infarction) (HCC) 04/05/2019  . Pre-diabetes    Surgical Hx: The patient  has a past surgical history that includes LEFT HEART CATH AND CORONARY ANGIOGRAPHY (N/A, 04/05/2019) and CORONARY STENT INTERVENTION (N/A, 04/05/2019).   Current Medications: Current Meds  Medication Sig  . aspirin 81 MG chewable tablet Chew 1 tablet (81 mg total) by mouth daily.  Marland Kitchen atorvastatin (LIPITOR) 80 MG tablet Take 1 tablet (80 mg total) by mouth daily at 6 PM.  . clopidogrel (PLAVIX) 75 MG tablet Take 1 tablet (75 mg total) by mouth daily.  Marland Kitchen losartan (COZAAR) 25 MG tablet Take 1 tablet (25 mg total) by mouth daily.  . metoprolol tartrate (LOPRESSOR) 25 MG tablet TAKE 1 TABLET BY MOUTH TWO TIMES DAILY.  . nitroGLYCERIN (NITROSTAT) 0.4 MG  SL tablet Place 1 tablet (0.4 mg total) under the tongue every 5 (five) minutes x 3 doses as needed for chest pain.  . vitamin C (ASCORBIC ACID) 500 MG tablet Take 500 mg by mouth 2 (two) times daily.     Allergies:   Patient has no known allergies.   Social History   Tobacco Use  . Smoking status: Former Smoker    Types: Cigarettes    Quit date: 10/06/2006    Years since quitting: 13.0  . Smokeless tobacco: Never Used  Substance Use Topics  . Alcohol use: Not Currently    Comment:    . Drug use: No     Family Hx: The patient's family history includes Bladder Cancer in his father; CAD in his maternal grandfather and paternal grandmother; Diabetes in his father, maternal grandmother, and paternal grandmother; Hyperlipidemia in his mother; Hypertension in his father, maternal grandfather, and maternal grandmother; Kidney disease in his maternal grandmother.  ROS:   Please see the history of present illness.    ROS All other systems reviewed and are negative.   EKGs/Labs/Other Test Reviewed:    EKG:  EKG is not ordered today.  The ekg ordered today demonstrates n/a  Recent Labs: 04/05/2019: TSH 2.894 04/06/2019: BUN 11; Creatinine, Ser 1.15;  Hemoglobin 15.9; Platelets 253; Potassium 4.2; Sodium 138 06/23/2019: ALT 58   Recent Lipid Panel Lab Results  Component Value Date/Time   CHOL 97 (L) 05/23/2019 09:59 AM   TRIG 100 05/23/2019 09:59 AM   HDL 29 (L) 05/23/2019 09:59 AM   CHOLHDL 3.3 05/23/2019 09:59 AM   CHOLHDL 6.6 04/06/2019 05:02 AM   LDLCALC 48 05/23/2019 09:59 AM    Physical Exam:    VS:  BP 120/76   Pulse 60   Ht 6\' 1"  (1.854 m)   Wt 269 lb 1.9 oz (122.1 kg)   SpO2 96%   BMI 35.51 kg/m     Wt Readings from Last 3 Encounters:  10/12/19 269 lb 1.9 oz (122.1 kg)  04/15/19 277 lb (125.6 kg)  04/06/19 268 lb 15.4 oz (122 kg)     Physical Exam  Constitutional: He is oriented to person, place, and time. He appears well-developed and well-nourished. No  distress.  HENT:  Head: Normocephalic and atraumatic.  Eyes: No scleral icterus.  Neck: No JVD present. No thyromegaly present.  Cardiovascular: Normal rate, regular rhythm and normal heart sounds.  No murmur heard. Pulmonary/Chest: Effort normal and breath sounds normal. He has no rales.  Abdominal: Soft. There is no hepatomegaly. There is no abdominal tenderness.  Musculoskeletal:        General: No edema.  Lymphadenopathy:    He has no cervical adenopathy.  Neurological: He is alert and oriented to person, place, and time.  Skin: Skin is warm and dry.  Psychiatric: He has a normal mood and affect.    ASSESSMENT & PLAN:    1. Coronary artery disease involving native coronary artery of native heart without angina pectoris History of non-ST elevation myocardial infarction June 2020 treated with a drug-eluting stent to the LAD.  He has mild to moderate non-obstructive residual CAD in the LAD, 2 obtuse marginals and the RCA.  He is doing well without anginal symptoms.  He was unable to forward Ticagrelor and was switched to clopidogrel.  Continue current management which includes aspirin, atorvastatin, clopidogrel, losartan, metoprolol tartrate.  2. Essential hypertension The patient's blood pressure is controlled on his current regimen.  Continue current therapy.   3. Hyperlipidemia, unspecified hyperlipidemia type LDL optimal on most recent lab work.  Continue current Rx.    4. Elevated LFTs Recent LFTs improved.  He does not have a primary care provider.  I will refer him to the [facility].  5. Prediabetes We discussed the importance of reducing sugar in his diet.  As noted above, refer to primary care.    Dispo:  Return in about 6 months (around 04/10/2020) for Routine Follow Up, w/ Dr. Burt Knack, or Richardson Dopp, PA-C, in person.   Medication Adjustments/Labs and Tests Ordered: Current medicines are reviewed at length with the patient today.   Concerns regarding medicines are outlined above.  Tests Ordered: Orders Placed This Encounter  Procedures  . Ambulatory referral to [facility]   Medication Changes: No orders of the defined types were placed in this encounter.   Signed, Richardson Dopp, PA-C  10/12/2019 8:49 AM    [facility] [place], [place], [place]  [address] Phone: [phone]; Fax: [phone]

## 2019-10-12 ENCOUNTER — Ambulatory Visit (INDEPENDENT_AMBULATORY_CARE_PROVIDER_SITE_OTHER): Payer: Self-pay | Admitting: Physician Assistant

## 2019-10-12 ENCOUNTER — Other Ambulatory Visit: Payer: Self-pay

## 2019-10-12 ENCOUNTER — Encounter: Payer: Self-pay | Admitting: Physician Assistant

## 2019-10-12 VITALS — BP 120/76 | HR 60 | Ht 73.0 in | Wt 269.1 lb

## 2019-10-12 DIAGNOSIS — R7303 Prediabetes: Secondary | ICD-10-CM

## 2019-10-12 DIAGNOSIS — R7989 Other specified abnormal findings of blood chemistry: Secondary | ICD-10-CM

## 2019-10-12 DIAGNOSIS — E785 Hyperlipidemia, unspecified: Secondary | ICD-10-CM

## 2019-10-12 DIAGNOSIS — I10 Essential (primary) hypertension: Secondary | ICD-10-CM

## 2019-10-12 DIAGNOSIS — I2510 Atherosclerotic heart disease of native coronary artery without angina pectoris: Secondary | ICD-10-CM

## 2019-10-12 MED ORDER — METOPROLOL TARTRATE 25 MG PO TABS: 25.0000 mg | ORAL_TABLET | Freq: Two times a day (BID) | ORAL | 3 refills | Status: DC

## 2019-10-12 MED ORDER — CLOPIDOGREL BISULFATE 75 MG PO TABS: 75.0000 mg | ORAL_TABLET | Freq: Every day | ORAL | 3 refills | Status: DC

## 2019-10-12 MED ORDER — LOSARTAN POTASSIUM 25 MG PO TABS: 25.0000 mg | ORAL_TABLET | Freq: Every day | ORAL | 3 refills | Status: DC

## 2019-10-12 MED ORDER — ATORVASTATIN CALCIUM 80 MG PO TABS: 80.0000 mg | ORAL_TABLET | Freq: Every day | ORAL | 3 refills | Status: DC

## 2019-10-12 MED FILL — ATORVASTATIN 80 MG TABLET: 80 | 90 days supply | Qty: 90 | Fill #0

## 2019-10-12 MED FILL — LOSARTAN POTASSIUM 25 MG TA: 25 | 90 days supply | Qty: 90 | Fill #0

## 2019-10-12 MED FILL — METOPROLOL TARTRATE 25 MG T: 25 | 90 days supply | Qty: 180 | Fill #0

## 2019-10-12 MED FILL — CLOPIDOGREL 75 MG TABLET: 75 | 90 days supply | Qty: 90 | Fill #0

## 2019-10-12 NOTE — Patient Instructions (Signed)
Medication Instructions:   Your physician recommends that you continue on your current medications as directed. Please refer to the Current Medication list given to you today.  *If you need a refill on your cardiac medications before your next appointment, please call your pharmacy*  Lab Work:  None ordered today  Testing/Procedures:  None ordered today  Follow-Up: At [facility], you and your health needs are our priority.  As part of our continuing mission to provide you with exceptional heart care, we have created designated Provider Care Teams.  These Care Teams include your primary Cardiologist (physician) and Advanced Practice Providers (APPs -  Physician Assistants and Nurse Practitioners) who all work together to provide you with the care you need, when you need it.  Your next appointment:   6 month(s)  The format for your next appointment:   Either In Person or Virtual  Provider:   You may see Tonny Bollman, MD or one of the following Advanced Practice Providers on your designated Care Team:    Tereso Newcomer, PA-C  Vin Rosita, [place]  Berton Bon, NP   Other Instructions  We are starting a referral to [facility]

## 2019-11-07 ENCOUNTER — Encounter: Payer: Self-pay | Admitting: Physician Assistant

## 2020-01-20 MED FILL — ATORVASTATIN 80 MG TABLET: 80 | 90 days supply | Qty: 90 | Fill #1

## 2020-01-20 MED FILL — METOPROLOL TARTRATE 25 MG T: 25 | 90 days supply | Qty: 180 | Fill #1

## 2020-01-20 MED FILL — LOSARTAN POTASSIUM 25 MG TA: 25 | 90 days supply | Qty: 90 | Fill #1

## 2020-04-18 ENCOUNTER — Ambulatory Visit (INDEPENDENT_AMBULATORY_CARE_PROVIDER_SITE_OTHER): Payer: Self-pay | Admitting: Physician Assistant

## 2020-04-18 ENCOUNTER — Other Ambulatory Visit: Payer: Self-pay

## 2020-04-18 ENCOUNTER — Encounter: Payer: Self-pay | Admitting: Physician Assistant

## 2020-04-18 VITALS — BP 110/70 | HR 58 | Ht 73.0 in | Wt 285.8 lb

## 2020-04-18 DIAGNOSIS — G252 Other specified forms of tremor: Secondary | ICD-10-CM

## 2020-04-18 DIAGNOSIS — I10 Essential (primary) hypertension: Secondary | ICD-10-CM

## 2020-04-18 DIAGNOSIS — E782 Mixed hyperlipidemia: Secondary | ICD-10-CM

## 2020-04-18 DIAGNOSIS — R7989 Other specified abnormal findings of blood chemistry: Secondary | ICD-10-CM

## 2020-04-18 DIAGNOSIS — I2510 Atherosclerotic heart disease of native coronary artery without angina pectoris: Secondary | ICD-10-CM

## 2020-04-18 NOTE — Progress Notes (Signed)
Cardiology Office Note:    Date:  04/18/2020   ID:  Kevin [name], DOB 1966/09/07, MRN [id number]  PCP:  Patient, No Pcp Per  Cardiologist:  Tonny Bollman, MD  Electrophysiologist:  None   Referring MD: No ref. provider found   Chief Complaint  Patient presents with  . Follow-up    CAD   Patient Profile: Kevin [name] is a 54 y.o. male   Coronary artery disease   S/p NSTEMI 03/2019 >> PCI:  DES to pLAD  Hyperlipidemia   Hypertension   Prediabetes   Prior CV Studies: Cardiac catheterization 2019-04-12  LAD ost 95, mid 30, dist 50 OM1 50, OM2 70 RCA prox 50 EF 65 PCI:  3 x 12 Synergy DES to ost LAD  History of Present Illness:    Kevin [name] returns for follow up.  He is here alone.  He is overall doing well without chest pain, shortness of breath, syncope, orthopnea, leg swelling.  He tries to push mow his yard.  We discussed the importance of regular walking for exercise and to avoid extreme heat.  He has noted a tremor in his R thumb.   Past Medical History:  Diagnosis Date  . Coronary artery disease    a. 04-12-19 NSTEMI, DES to pLAD,   . Hyperlipidemia LDL goal <70   . Hypertension   . NSTEMI (non-ST elevated myocardial infarction) (HCC) 2019/04/12  . Pre-diabetes    Surgical Hx: The patient  has a past surgical history that includes LEFT HEART CATH AND CORONARY ANGIOGRAPHY (N/A, Apr 12, 2019) and CORONARY STENT INTERVENTION (N/A, Apr 12, 2019).   Current Medications: Current Meds  Medication Sig  . aspirin 81 MG chewable tablet Chew 1 tablet (81 mg total) by mouth daily.  Marland Kitchen atorvastatin (LIPITOR) 80 MG tablet Take 1 tablet (80 mg total) by mouth daily at 6 PM.  . clopidogrel (PLAVIX) 75 MG tablet Take 1 tablet (75 mg total) by mouth daily.  Marland Kitchen losartan (COZAAR) 25 MG tablet Take 1 tablet (25 mg total) by mouth daily.  . metoprolol tartrate (LOPRESSOR) 25 MG tablet Take 1 tablet (25 mg total) by mouth 2 (two) times daily.  . nitroGLYCERIN (NITROSTAT) 0.4  MG SL tablet Place 1 tablet (0.4 mg total) under the tongue every 5 (five) minutes x 3 doses as needed for chest pain.  . vitamin C (ASCORBIC ACID) 500 MG tablet Take 500 mg by mouth 2 (two) times daily.     Allergies:   Patient has no known allergies.   Social History   Tobacco Use  . Smoking status: Former Smoker    Types: Cigarettes    Quit date: 10/06/2006    Years since quitting: 13.5  . Smokeless tobacco: Never Used  Vaping Use  . Vaping Use: Never used  Substance Use Topics  . Alcohol use: Not Currently    Comment:    . Drug use: No     Family Hx: The patient's family history includes Bladder Cancer in his father; CAD in his maternal grandfather and paternal grandmother; Diabetes in his father, maternal grandmother, and paternal grandmother; Hyperlipidemia in his mother; Hypertension in his father, maternal grandfather, and maternal grandmother; Kidney disease in his maternal grandmother.  ROS:   Please see the history of present illness.    ROS All other systems reviewed and are negative.   EKGs/Labs/Other Test Reviewed:    EKG:  EKG is ordered today.  The ekg ordered today demonstrates sinus bradycardia, HR 58, normal axis, no ST-TW  changes, QTc 400  Recent Labs: 06/23/2019: ALT 58   Recent Lipid Panel Lab Results  Component Value Date/Time   CHOL 97 (L) 05/23/2019 09:59 AM   TRIG 100 05/23/2019 09:59 AM   HDL 29 (L) 05/23/2019 09:59 AM   CHOLHDL 3.3 05/23/2019 09:59 AM   CHOLHDL 6.6 04/06/2019 05:02 AM   LDLCALC 48 05/23/2019 09:59 AM    Physical Exam:    VS:  BP 110/70   Pulse (!) 58   Ht 6\' 1"  (1.854 m)   Wt 285 lb 12.8 oz (129.6 kg)   SpO2 98%   BMI 37.71 kg/m     Wt Readings from Last 3 Encounters:  04/18/20 285 lb 12.8 oz (129.6 kg)  10/12/19 269 lb 1.9 oz (122.1 kg)  04/15/19 277 lb (125.6 kg)     Physical Exam Constitutional:      General: He is not in acute distress. HENT:     Head: Normocephalic and atraumatic.  Cardiovascular:      Rate and Rhythm: Normal rate and regular rhythm.     Heart sounds: No murmur heard.   Pulmonary:     Breath sounds: No wheezing or rales.  Abdominal:     Palpations: Abdomen is soft.  Musculoskeletal:        General: No swelling.     Cervical back: Neck supple.  Skin:    General: Skin is warm and dry.  Neurological:     General: No focal deficit present.     Mental Status: He is alert and oriented to person, place, and time.     Motor: Tremor (R thumb (resting)) present.       ASSESSMENT & PLAN:    1. Coronary artery disease involving native coronary artery of native heart without angina pectoris History of non-ST elevation myocardial infarction June 2020 treated with a drug-eluting stent to the LAD.  He has mild to moderate non-obstructive residual CAD in the LAD, 2 obtuse marginals and the RCA.  He is not having anginal symptoms.  He may discontinue Clopidogrel now that he is > 1 year out from his MI.  Continue ASA, statin, beta-blocker.  FU in 1 year.   2. Essential hypertension The patient's blood pressure is controlled on his current regimen.  Continue current therapy.    3. Mixed hyperlipidemia 4. Elevated LFTs Continue high intensity statin.  Arrange CMET, Lipids in 3 mos.  He was to establish with a PCP for follow up on his elevated LFTs.  He still does not have a PCP.  If his LFTs remain elevated, I will refer him to GI.  He should also establish with primary care.    5. Resting tremor He has noted this only is in R thumb.  This occurs without provocation.  I will refer to Neuro for eval.    Dispo:  Return in about 1 year (around 04/18/2021) for Routine Follow Up, w/ Dr. 04/20/2021, or Excell Seltzer, PA-C, in person.   Medication Adjustments/Labs and Tests Ordered: Current medicines are reviewed at length with the patient today.  Concerns regarding medicines are outlined above.  Tests Ordered: Orders Placed This Encounter  Procedures  . Comprehensive metabolic panel  .  Lipid panel  . Ambulatory referral to Neurology  . EKG 12-Lead   Medication Changes: No orders of the defined types were placed in this encounter.   Signed, Tereso Newcomer, PA-C  04/18/2020 4:41 PM    [facility] [address] [place], [place],  [place]  [address] Phone: [phone]; Fax: [phone]

## 2020-04-18 NOTE — Patient Instructions (Signed)
Medication Instructions:  Your physician has recommended you make the following change in your medication:   1) Finish your Plavix bottle and then stop the medication  *If you need a refill on your cardiac medications before your next appointment, please call your pharmacy*  Lab Work: Your physician recommends that you return for lab work in: CMET/Lipids  If you have labs (blood work) drawn today and your tests are completely normal, you will receive your results only by: Marland Kitchen MyChart Message (if you have MyChart) OR . A paper copy in the mail If you have any lab test that is abnormal or we need to change your treatment, we will call you to review the results.  Testing/Procedures: None ordered today  Follow-Up: At [facility], you and your health needs are our priority.  As part of our continuing mission to provide you with exceptional heart care, we have created designated Provider Care Teams.  These Care Teams include your primary Cardiologist (physician) and Advanced Practice Providers (APPs -  Physician Assistants and Nurse Practitioners) who all work together to provide you with the care you need, when you need it.  We recommend signing up for the patient portal called "MyChart".  Sign up information is provided on this After Visit Summary.  MyChart is used to connect with patients for Virtual Visits (Telemedicine).  Patients are able to view lab/test results, encounter notes, upcoming appointments, etc.  Non-urgent messages can be sent to your provider as well.   To learn more about what you can do with MyChart, go to [email or web address].    Your next appointment:   12 month(s)  The format for your next appointment:   In Person  Provider:   You may see Tonny Bollman, MD or Tereso Newcomer, PA-C

## 2020-04-27 MED FILL — ATORVASTATIN 80 MG TABLET: 80 | 90 days supply | Qty: 90 | Fill #2

## 2020-04-27 MED FILL — METOPROLOL TARTRATE 25 MG T: 25 | 90 days supply | Qty: 180 | Fill #2

## 2020-04-27 MED FILL — LOSARTAN POTASSIUM 25 MG TA: 25 | 90 days supply | Qty: 90 | Fill #2

## 2020-05-15 ENCOUNTER — Other Ambulatory Visit: Payer: Self-pay | Admitting: Cardiology

## 2020-05-15 MED FILL — NITROGLYCERIN 0.4 MG TAB SL: 0.4 | 7 days supply | Qty: 25 | Fill #0

## 2020-06-12 ENCOUNTER — Encounter (HOSPITAL_COMMUNITY): Payer: Self-pay | Admitting: Obstetrics and Gynecology

## 2020-06-12 ENCOUNTER — Emergency Department (HOSPITAL_COMMUNITY): Payer: Self-pay

## 2020-06-12 ENCOUNTER — Other Ambulatory Visit: Payer: Self-pay

## 2020-06-12 DIAGNOSIS — W11XXXA Fall on and from ladder, initial encounter: Secondary | ICD-10-CM | POA: Insufficient documentation

## 2020-06-12 DIAGNOSIS — Y9389 Activity, other specified: Secondary | ICD-10-CM | POA: Insufficient documentation

## 2020-06-12 DIAGNOSIS — Z79899 Other long term (current) drug therapy: Secondary | ICD-10-CM | POA: Insufficient documentation

## 2020-06-12 DIAGNOSIS — L03115 Cellulitis of right lower limb: Secondary | ICD-10-CM | POA: Insufficient documentation

## 2020-06-12 DIAGNOSIS — Y999 Unspecified external cause status: Secondary | ICD-10-CM | POA: Insufficient documentation

## 2020-06-12 DIAGNOSIS — Y929 Unspecified place or not applicable: Secondary | ICD-10-CM | POA: Insufficient documentation

## 2020-06-12 DIAGNOSIS — Z87891 Personal history of nicotine dependence: Secondary | ICD-10-CM | POA: Insufficient documentation

## 2020-06-12 DIAGNOSIS — I10 Essential (primary) hypertension: Secondary | ICD-10-CM | POA: Insufficient documentation

## 2020-06-12 DIAGNOSIS — Z955 Presence of coronary angioplasty implant and graft: Secondary | ICD-10-CM | POA: Insufficient documentation

## 2020-06-12 DIAGNOSIS — S81801A Unspecified open wound, right lower leg, initial encounter: Secondary | ICD-10-CM | POA: Insufficient documentation

## 2020-06-12 DIAGNOSIS — I2510 Atherosclerotic heart disease of native coronary artery without angina pectoris: Secondary | ICD-10-CM | POA: Insufficient documentation

## 2020-06-12 NOTE — ED Triage Notes (Signed)
Patient reports to the ER for falling off a ladder. Patient reports he has a scrape that he believes to be infected on the right leg.

## 2020-06-13 ENCOUNTER — Emergency Department (HOSPITAL_COMMUNITY)
Admission: EM | Admit: 2020-06-13 | Discharge: 2020-06-13 | Disposition: A | Discharge status: Home / Self Care | Payer: Self-pay | Attending: Emergency Medicine | Admitting: Emergency Medicine

## 2020-06-13 DIAGNOSIS — L03115 Cellulitis of right lower limb: Secondary | ICD-10-CM

## 2020-06-13 DIAGNOSIS — S81801A Unspecified open wound, right lower leg, initial encounter: Secondary | ICD-10-CM

## 2020-06-13 LAB — BASIC METABOLIC PANEL
Anion gap: 15 (ref 5–15)
BUN: 15 mg/dL (ref 6–20)
CO2: 20 mmol/L — ABNORMAL LOW (ref 22–32)
Calcium: 9.4 mg/dL (ref 8.9–10.3)
Chloride: 103 mmol/L (ref 98–111)
Creatinine, Ser: 1.04 mg/dL (ref 0.61–1.24)
GFR calc Af Amer: >60 mL/min (ref >60)
GFR calc non Af Amer: >60 mL/min (ref >60)
Glucose, Bld: 121 mg/dL — ABNORMAL HIGH (ref 70–99)
Potassium: 3.9 mmol/L (ref 3.5–5.1)
Sodium: 138 mmol/L (ref 135–145)

## 2020-06-13 LAB — CBC WITH DIFFERENTIAL/PLATELET
Abs Immature Granulocytes: 0.16 10*3/uL — ABNORMAL HIGH (ref 0.00–0.07)
Basophils Absolute: 0.1 10*3/uL (ref 0.0–0.1)
Basophils Relative: 1 %
Eosinophils Absolute: 0.1 10*3/uL (ref 0.0–0.5)
Eosinophils Relative: 1 %
HCT: 41.6 % (ref 39.0–52.0)
Hemoglobin: 14 g/dL (ref 13.0–17.0)
Immature Granulocytes: 1 %
Lymphocytes Relative: 18 %
Lymphs Abs: 2.1 10*3/uL (ref 0.7–4.0)
MCH: 30 pg (ref 26.0–34.0)
MCHC: 33.7 g/dL (ref 30.0–36.0)
MCV: 89.3 fL (ref 80.0–100.0)
Monocytes Absolute: 1.2 10*3/uL — ABNORMAL HIGH (ref 0.1–1.0)
Monocytes Relative: 10 %
Neutro Abs: 8.2 10*3/uL — ABNORMAL HIGH (ref 1.7–7.7)
Neutrophils Relative %: 69 %
Platelets: 246 10*3/uL (ref 150–400)
RBC: 4.66 MIL/uL (ref 4.22–5.81)
RDW: 13.4 % (ref 11.5–15.5)
WBC: 11.9 10*3/uL — ABNORMAL HIGH (ref 4.0–10.5)
nRBC: 0 % (ref 0.0–0.2)

## 2020-06-13 LAB — C-REACTIVE PROTEIN: CRP: 4.1 mg/dL — ABNORMAL HIGH (ref <1.0)

## 2020-06-13 MED ORDER — SODIUM CHLORIDE 0.9 % IV SOLN
100.0000 mg | Freq: Once | INTRAVENOUS | Status: AC
  Administered 2020-06-13: 100 mg via INTRAVENOUS
  Filled 2020-06-13: qty 100

## 2020-06-13 MED ORDER — DOXYCYCLINE HYCLATE 100 MG PO CAPS: 100.0000 mg | ORAL_CAPSULE | Freq: Two times a day (BID) | ORAL | 0 refills | Status: DC

## 2020-06-13 MED FILL — DOXYCYCLINE HYCLATE 100 MG: 100 | 10 days supply | Qty: 20 | Fill #0

## 2020-06-13 NOTE — Discharge Instructions (Addendum)
Take the antibiotics prescribed to you until finished.  Change your dressing at least once per day to keep the area clean; we recommend wet-to-dry dressing changes to prevent the gauze from sticking to your wound site. You may use over-the-counter bacitracin on your wound twice a day. We recommend close follow-up with Dr. Adrienne Mocha for ongoing wound care. Return for worsening or concerning symptoms such as pus drainage, chills, fever, or extension of redness up your leg.

## 2020-06-13 NOTE — ED Provider Notes (Signed)
[facility]-EMERGENCY DEPT Provider Note   CSN: [id number] Arrival date & time: 06/12/20  1921     History Chief Complaint  Patient presents with  . Leg Pain    Kevin [name] is a 54 y.o. male.   54 y/o male with hx of CAD and NSTEMI, HLD, HTN, preDM presents to the ED for RLE wound. States he was helping a friend 1 week ago when he fell off a ladder resulting in a scrape to this right leg. Was seen at [facility] a few days later with regular wound care and bandage placement. Has since noticed increased weeping and drainage from his wound site with bleeding. Patient reports increased redness around his wound. No pain at rest, but he does have pain in the RLE with weight bearing; using crutches for assistance. Told to come to the ED after going back to [facility] earlier in the day yesterday. No hx of fever or purulent drainage.  No longer taking Plavix, per patient.  The history is provided by the patient. No language interpreter was used.  Leg Pain      Past Medical History:  Diagnosis Date  . Coronary artery disease    a. 04/05/19 NSTEMI, DES to pLAD,   . Hyperlipidemia LDL goal <70   . Hypertension   . NSTEMI (non-ST elevated myocardial infarction) (HCC) 04/05/2019  . Pre-diabetes     Patient Active Problem List   Diagnosis Date Noted  . CAD (coronary artery disease) 04/15/2019  . Hyperlipidemia 04/06/2019  . Hypertension 04/06/2019  . Pre-diabetes 04/06/2019  . NSTEMI (non-ST elevated myocardial infarction) (HCC) 04/05/2019    Past Surgical History:  Procedure Laterality Date  . CORONARY STENT INTERVENTION N/A 04/05/2019   Procedure: CORONARY STENT INTERVENTION;  Surgeon: Tonny Bollman, MD;  Location: [facility] INVASIVE CV LAB;  Service: Cardiovascular;  Laterality: N/A;  . LEFT HEART CATH AND CORONARY ANGIOGRAPHY N/A 04/05/2019   Procedure: LEFT HEART CATH AND CORONARY ANGIOGRAPHY;  Surgeon: Tonny Bollman, MD;  Location: [facility] INVASIVE CV LAB;  Service:  Cardiovascular;  Laterality: N/A;       Family History  Problem Relation Age of Onset  . Hyperlipidemia Mother   . Diabetes Father   . Hypertension Father   . Bladder Cancer Father   . Hypertension Maternal Grandmother   . Diabetes Maternal Grandmother   . Kidney disease Maternal Grandmother   . CAD Maternal Grandfather        died of MI at 74  . Hypertension Maternal Grandfather   . CAD Paternal Grandmother   . Diabetes Paternal Grandmother     Social History   Tobacco Use  . Smoking status: Former Smoker    Types: Cigarettes    Quit date: 10/06/2006    Years since quitting: 13.6  . Smokeless tobacco: Never Used  Vaping Use  . Vaping Use: Never used  Substance Use Topics  . Alcohol use: Not Currently    Comment:    . Drug use: No    Home Medications Prior to Admission medications   Medication Sig Start Date End Date Taking? Authorizing Provider  aspirin 81 MG chewable tablet Chew 1 tablet (81 mg total) by mouth daily. 04/07/19  Yes Arty Baumgartner, NP  atorvastatin (LIPITOR) 80 MG tablet Take 1 tablet (80 mg total) by mouth daily at 6 PM. 10/12/19 10/06/20 Yes Weaver, Lorin Picket T, PA-C  losartan (COZAAR) 25 MG tablet Take 1 tablet (25 mg total) by mouth daily. 10/12/19 10/06/20 Yes Tereso Newcomer  T, PA-C  metoprolol tartrate (LOPRESSOR) 25 MG tablet Take 1 tablet (25 mg total) by mouth 2 (two) times daily. 10/12/19 10/06/20 Yes Weaver, Scott T, PA-C  nitroGLYCERIN (NITROSTAT) 0.4 MG SL tablet PLACE 1 TABLET UNDER THE TONGUE EVERY FIVE MINUTES X 3 DOSES AS NEEDED FOR CHEST PAIN. Patient taking differently: Place 0.4 mg under the tongue every 5 (five) minutes as needed for chest pain.  05/15/20  Yes Tonny Bollman, MD  vitamin C (ASCORBIC ACID) 500 MG tablet Take 500 mg by mouth 2 (two) times daily.   Yes [provider]  clopidogrel (PLAVIX) 75 MG tablet Take 1 tablet (75 mg total) by mouth daily. Patient not taking: Reported on 06/13/2020 10/12/19 10/06/20  Tereso Newcomer T, PA-C    doxycycline (VIBRAMYCIN) 100 MG capsule Take 1 capsule (100 mg total) by mouth 2 (two) times daily. 06/13/20   Antony Madura, PA-C    Allergies    Patient has no known allergies.  Review of Systems   Review of Systems  Ten systems reviewed and are negative for acute change, except as noted in the HPI.    Physical Exam Updated Vital Signs BP (!) 143/58 (BP Location: Right Arm)   Pulse 78   Temp 98.3 F (36.8 C) (Oral)   Resp 16   Ht 6\' 1"  (1.854 m)   Wt 127 kg   SpO2 95%   BMI 36.94 kg/m   Physical Exam Vitals and nursing note reviewed.  Constitutional:      General: He is not in acute distress.    Appearance: He is well-developed. He is not diaphoretic.     Comments: Obese male, in NAD  HENT:     Head: Normocephalic and atraumatic.  Eyes:     General: No scleral icterus.    Conjunctiva/sclera: Conjunctivae normal.  Pulmonary:     Effort: Pulmonary effort is normal. No respiratory distress.     Comments: Respirations even and unlabored Musculoskeletal:        General: Normal range of motion.     Cervical back: Normal range of motion.     Right lower leg: Edema (2+ pitting) present.  Skin:    General: Skin is warm and dry.     Coloration: Skin is not pale.     Comments: Weeping wound to anterior RLE with skin desquamation and central eschar. Surrounding blanching erythema, mild heat to touch. Bruising also noted in various stages. No lymphangitic streaking.  Neurological:     Mental Status: He is alert and oriented to person, place, and time.     Comments: Ambulatory with antalgic gait; assistance with crutches  Psychiatric:        Behavior: Behavior normal.          ED Results / Procedures / Treatments   Labs (all labs ordered are listed, but only abnormal results are displayed) Labs Reviewed  CBC WITH DIFFERENTIAL/PLATELET - Abnormal; Notable for the following components:      Result Value   WBC 11.9 (*)    Neutro Abs 8.2 (*)    Monocytes Absolute 1.2  (*)    Abs Immature Granulocytes 0.16 (*)    All other components within normal limits  BASIC METABOLIC PANEL  C-REACTIVE PROTEIN    EKG None  Radiology DG Tibia/Fibula Right  Result Date: 06/12/2020 CLINICAL DATA:  Fall EXAM: RIGHT TIBIA AND FIBULA - 2 VIEW COMPARISON:  None. FINDINGS: No fracture or malalignment. No periostitis. Diffuse soft tissue edema. IMPRESSION: No acute osseous  abnormality. Electronically Signed   By: Jasmine Pang M.D.   On: 06/12/2020 20:26    Procedures Procedures (including critical care time)  Medications Ordered in ED Medications  doxycycline (VIBRAMYCIN) 100 mg in sodium chloride 0.9 % 250 mL IVPB (has no administration in time range)    ED Course  I have reviewed the triage vital signs and the nursing notes.  Pertinent labs & imaging results that were available during my care of the patient were reviewed by me and considered in my medical decision making (see chart for details).  Clinical Course as of Jun 14 619  Wed Jun 13, 2020  2094 Worsening wound to RLE. Does have a degree of dependent edema at baseline, likely contributing to poor wound healing. Signs of early cellulitis with blanching erythema, increased pain. Pending CBC to assess for leukocytosis. No fever or criteria for SIRS/sepsis. Will start on IV abx in the department. Xray reassuring without evidence of fx of subcutaneous gas formation.   [KH]  [phone] Patient with mild leukocytosis, but does not meet SIRS/sepsis criteria. Plan for IV doxycycline with discharge on course of same PO abx. Will give referral to Dr. Adrienne Mocha for ongoing wound care. Strict return precautions. Patient seen and evaluated also by Dr. Blinda Leatherwood who is in agreement with this assessment and plan.   [KH]  0620 Patient aware and agreeable to plan. All questions answered.   [KH]    Clinical Course User Index [KH] Darylene Price   MDM Rules/Calculators/A&P                          54 year old male  presents to the emergency department for evaluation of wound to his right lower extremity which began 1 week ago.  Reports worsening of his wound with increased redness.  Was advised to seek evaluation in the ED after presenting to urgent care.  While the patient does have signs of early infection, he is nontoxic and there is no purulent drainage or fever.  No evidence of subcutaneous gas on x-ray imaging and chronicity of injury makes necrotizing fasciitis unlikely.  Does have a mild leukocytosis, but no criteria for SIRS/sepsis.  Likely experiencing poor healing secondary to chronic dependent edema.  Compartments of the right lower extremity are compressible.  Evidence of good perfusion distally to wound site.  Plan for dose of IV antibiotics in the emergency department.  Will subsequently discharged with 10-day course of oral antibiotics.  Patient would benefit from ongoing wound care.  Referral provided to Dr. Barbee Cough.  Return precautions discussed prior to shift change.  Anticipate d/c upon completion of IV abx.   Final Clinical Impression(s) / ED Diagnoses Final diagnoses:  Open wound of right lower leg, initial encounter  Cellulitis of right lower extremity    Rx / DC Orders ED Discharge Orders         Ordered    doxycycline (VIBRAMYCIN) 100 MG capsule  2 times daily        06/13/20 0614           Antony Madura, PA-C 06/13/20 2836    Gilda Crease, MD 06/13/20 [phone]

## 2020-06-13 NOTE — ED Provider Notes (Signed)
Patient is a 54 year old male whose care was transferred to me at shift change from [facility].  Her HPI is below:  54 y/o male with hx of CAD and NSTEMI, HLD, HTN, preDM presents to the ED for RLE wound. States he was helping a friend 1 week ago when he fell off a ladder resulting in a scrape to this right leg. Was seen at [facility] a few days later with regular wound care and bandage placement. Has since noticed increased weeping and drainage from his wound site with bleeding. Patient reports increased redness around his wound. No pain at rest, but he does have pain in the RLE with weight bearing; using crutches for assistance. Told to come to the ED after going back to [facility] earlier in the day yesterday. No hx of fever or purulent drainage.  Physical Exam  BP (!) 143/58 (BP Location: Right Arm)   Pulse 78   Temp 98.3 F (36.8 C) (Oral)   Resp 16   Ht 6\' 1"  (1.854 m)   Wt 127 kg   SpO2 95%   BMI 36.94 kg/m   Physical Exam Vitals and nursing note reviewed.  Constitutional:      Appearance: He is obese.  HENT:     Head: Normocephalic and atraumatic.     Nose: Nose normal.     Mouth/Throat:     Pharynx: Oropharynx is clear.  Eyes:     Extraocular Movements: Extraocular movements intact.     Conjunctiva/sclera: Conjunctivae normal.  Cardiovascular:     Rate and Rhythm: Normal rate and regular rhythm.     Pulses: Normal pulses.     Heart sounds: Normal heart sounds. No murmur heard.  No friction rub. No gallop.   Pulmonary:     Effort: Pulmonary effort is normal. No respiratory distress.     Breath sounds: Normal breath sounds. No stridor. No wheezing, rhonchi or rales.  Abdominal:     General: Abdomen is flat.     Palpations: Abdomen is soft.     Tenderness: There is no abdominal tenderness.  Musculoskeletal:        General: Normal range of motion.     Cervical back: Normal range of motion.  Skin:    General: Skin is warm and dry.     Comments: Please see images in chart below from  prior provider.   Neurological:     General: No focal deficit present.     Mental Status: He is alert and oriented to person, place, and time.  Psychiatric:        Mood and Affect: Mood normal.        Behavior: Behavior normal.    ED Course/Procedures   Clinical Course as of Jun 13 634  Wed Jun 13, 2020  0517 Worsening wound to RLE. Does have a degree of dependent edema at baseline, likely contributing to poor wound healing. Signs of early cellulitis with blanching erythema, increased pain. Pending CBC to assess for leukocytosis. No fever or criteria for SIRS/sepsis. Will start on IV abx in the department. Xray reassuring without evidence of fx of subcutaneous gas formation.   [KH]  [phone] Patient with mild leukocytosis, but does not meet SIRS/sepsis criteria. Plan for IV doxycycline with discharge on course of same PO abx. Will give referral to Dr. [id number] for ongoing wound care. Strict return precautions. Patient seen and evaluated also by Dr. Adrienne Mocha who is in agreement with this assessment and plan.   [KH]  0620 Patient  aware and agreeable to plan. All questions answered.   [KH]    Clinical Course User Index [KH] Antony Madura, PA-C    Procedures  MDM  Patient is a 54 year old male whose care was transferred to me at shift change from [facility].  Patient has a wound to the lower extremity and is having difficulty healing.  Signs of early cellulitis with blanching erythema as well as increased pain.  Very mild leukocytosis but patient does not meet SIRS/sepsis criteria.  Patient was started on IV doxycycline in the emergency department and given a prescription for p.o. doxycycline.  Patient was given a referral for ongoing wound care.  I reassessed patient and confirmed the above plan with him.  His questions were answered and he was amicable at the time of discharge.  His vital signs are stable.  Note: Portions of this report may have been transcribed using voice  recognition software. Every effort was made to ensure accuracy; however, inadvertent computerized transcription errors may be present.     Placido Sou, PA-C 06/13/20 6803    Alvira Monday, MD 06/13/20 [phone]

## 2020-06-22 ENCOUNTER — Other Ambulatory Visit: Payer: Self-pay

## 2020-06-22 ENCOUNTER — Ambulatory Visit (INDEPENDENT_AMBULATORY_CARE_PROVIDER_SITE_OTHER): Payer: Self-pay | Admitting: Plastic Surgery

## 2020-06-22 ENCOUNTER — Encounter: Payer: Self-pay | Admitting: Plastic Surgery

## 2020-06-22 DIAGNOSIS — S81801A Unspecified open wound, right lower leg, initial encounter: Secondary | ICD-10-CM | POA: Insufficient documentation

## 2020-06-22 DIAGNOSIS — Z719 Counseling, unspecified: Secondary | ICD-10-CM

## 2020-06-22 NOTE — Progress Notes (Signed)
Patient ID: Kevin [name], male    DOB: May 07, 1966, 54 y.o.   MRN: [id number]   Chief Complaint  Patient presents with  . Advice Only    The patient is a 54 year old gentleman here for evaluation of his right leg.  The patient fell from a ladder a week ago and sustained trauma to his right anterior leg.  He denies any fractures.  The wound involves the majority of the anterior tibia.  It is quite red and very swollen.  There is some eschar noted as well.  He has been on IV antibiotics and was switched to oral antibiotics.  He thinks that it is getting a little bit better.  He is a prediabetic and has coronary artery disease.   Review of Systems  Constitutional: Positive for activity change. Negative for appetite change.  Eyes: Negative.   Respiratory: Negative.   Cardiovascular: Positive for leg swelling.  Gastrointestinal: Negative.   Genitourinary: Negative.   Musculoskeletal: Positive for gait problem.  Skin: Positive for color change and wound.  Hematological: Negative.     Past Medical History:  Diagnosis Date  . Coronary artery disease    a. 04/05/19 NSTEMI, DES to pLAD,   . Hyperlipidemia LDL goal <70   . Hypertension   . NSTEMI (non-ST elevated myocardial infarction) (HCC) 04/05/2019  . Pre-diabetes     Past Surgical History:  Procedure Laterality Date  . CORONARY STENT INTERVENTION N/A 04/05/2019   Procedure: CORONARY STENT INTERVENTION;  Surgeon: Tonny Bollman, MD;  Location: [facility] INVASIVE CV LAB;  Service: Cardiovascular;  Laterality: N/A;  . LEFT HEART CATH AND CORONARY ANGIOGRAPHY N/A 04/05/2019   Procedure: LEFT HEART CATH AND CORONARY ANGIOGRAPHY;  Surgeon: Tonny Bollman, MD;  Location: [facility] INVASIVE CV LAB;  Service: Cardiovascular;  Laterality: N/A;      Current Outpatient Medications:  .  aspirin 81 MG chewable tablet, Chew 1 tablet (81 mg total) by mouth daily., Disp: 90 tablet, Rfl: 1 .  atorvastatin (LIPITOR) 80 MG tablet, Take 1 tablet (80 mg total)  by mouth daily at 6 PM., Disp: 90 tablet, Rfl: 3 .  doxycycline (VIBRAMYCIN) 100 MG capsule, Take 1 capsule (100 mg total) by mouth 2 (two) times daily., Disp: 20 capsule, Rfl: 0 .  losartan (COZAAR) 25 MG tablet, Take 1 tablet (25 mg total) by mouth daily., Disp: 90 tablet, Rfl: 3 .  metoprolol tartrate (LOPRESSOR) 25 MG tablet, Take 1 tablet (25 mg total) by mouth 2 (two) times daily., Disp: 180 tablet, Rfl: 3 .  nitroGLYCERIN (NITROSTAT) 0.4 MG SL tablet, PLACE 1 TABLET UNDER THE TONGUE EVERY FIVE MINUTES X 3 DOSES AS NEEDED FOR CHEST PAIN. (Patient taking differently: Place 0.4 mg under the tongue every 5 (five) minutes as needed for chest pain. ), Disp: 25 tablet, Rfl: 6 .  vitamin C (ASCORBIC ACID) 500 MG tablet, Take 500 mg by mouth 2 (two) times daily., Disp: , Rfl:    Objective:   Vitals:   06/22/20 1217  BP: (!) 157/114  Pulse: 62  Temp: 98.2 F (36.8 C)  SpO2: 98%    Physical Exam Vitals and nursing note reviewed.  Constitutional:      Appearance: Normal appearance.  HENT:     Head: Normocephalic and atraumatic.  Cardiovascular:     Rate and Rhythm: Normal rate.     Pulses: Normal pulses.  Pulmonary:     Effort: Pulmonary effort is normal.  Musculoskeletal:       Legs:  Neurological:     General: No focal deficit present.     Mental Status: He is alert and oriented to person, place, and time.  Psychiatric:        Mood and Affect: Mood normal.        Behavior: Behavior normal.     Assessment & Plan:  Wound of right lower extremity, initial encounter  I talked to the patient about options.  He would like to try the silver sock.  He may need some debridement in the near future.  I think if we can get the swelling improved that will help a great deal.  He is in agreement and purchased the socks.  I like to see him back in 1 to 2 weeks for further evaluation.  In the meantime he can shower.  If he does not want to wear the socks at night then Vaseline and an Ace wrap  will work. Pictures were obtained of the patient and placed in the chart with the patient's or guardian's permission.   Alena Bills Ninetta Adelstein, DO

## 2020-06-23 ENCOUNTER — Encounter: Payer: Self-pay | Admitting: Plastic Surgery

## 2020-07-03 NOTE — Progress Notes (Signed)
Patient is a 54 year old male here for follow-up on his right leg wound.  He fell from a ladder in early September and sustained trauma to the right anterior leg.  He has been treating conservatively utilizing silver socks since his last visit.  He can do Vaseline and an Ace wrap in the evening if he does not 1 to wear the socks.  Today were patient reports he feels like he is doing well.  States he has not been using the compression silver sock as it sticks to the wound.  He has been using Vaseline, nonstick pad, Kerlix, Ace wrap.  Wound is continuing to heal nicely.  There is a scab present at the center of the wound.  There is some chronic wound erythema surrounding the scab.  Bruising has resolved.  No signs of infection, drainage. Denies F/C, N/V.May continue to utilize either the silver sock or use Vaseline and wrap.  Follow-up in 3 to 4 weeks.  Call office with any questions/concerns.  The 21st Century Cures Act was signed into law in 2016 which includes the topic of electronic health records.  This provides immediate access to information in MyChart.  This includes consultation notes, operative notes, office notes, lab results and pathology reports.  If you have any questions about what you read please let us know at your next visit or call us at the office.  We are right here with you.

## 2020-07-05 ENCOUNTER — Encounter: Payer: Self-pay | Admitting: Plastic Surgery

## 2020-07-05 ENCOUNTER — Ambulatory Visit (INDEPENDENT_AMBULATORY_CARE_PROVIDER_SITE_OTHER): Payer: Self-pay | Admitting: Plastic Surgery

## 2020-07-05 ENCOUNTER — Other Ambulatory Visit: Payer: Self-pay

## 2020-07-05 VITALS — BP 115/79 | HR 55 | Temp 97.8°F

## 2020-07-05 DIAGNOSIS — S81801D Unspecified open wound, right lower leg, subsequent encounter: Secondary | ICD-10-CM

## 2020-07-12 ENCOUNTER — Telehealth: Payer: Self-pay

## 2020-07-12 NOTE — Telephone Encounter (Signed)
Patient called to let us know that he has a spot on his leg that gushes blood whenever he moves it.  He has applied vaseline and wrapped the area.  He wanted to see if we think he needs to be seen by [place] before his next scheduled appointment.

## 2020-07-12 NOTE — Telephone Encounter (Signed)
Returned pt's call regarding: his concern with an area of the right lower leg wound that is bleeding intermittently.  He states this is a new symptom that began Wed afternoon 07/11/20.  He denies any other changes or complications with the wound, and indicates that the wound bed appears to be improving/decreasing in size. He c/o moderate swelling to lower leg after walking/movement during the day- but this has not gotten worse.  He is changing the dressing daily using vaseline/non-stick dressing,gauze & ace wrap as per Dr. Ulice Bold & Loistine Simas, Pa orders. He has had to change the dressing 2x day for the last 2 days d/t bleeding- but it does not saturate or soak through the dressings. He has doubled the gauze & non-stick pad & using the ace wrap as compression to control the bleeding. He denies any change in the color of his leg/foot or toes & has complete ROM with the extremity/foot toes & ankle. He actually has not needed to use the cane that he initially was using & is ambulating better & more often than before. No increase in pain noted. He denies any redness in the surrounding area and no claudication pain. He states he has a cardiac history of  MI- in June 2020.  He was taking Plavix until his Cardiologist d/c it in July 2021- but he did prescribe 81 mg ASA- which is still taking daily.  He denies any chest pains or difficulty breathing at this time. I instructed the pt to continue with the above dressings/ace wraps for compression, but if bleeding increases or does not cease -he should go to ED or urgent care facility for immediate attention. He will not take the 81 mg ASA today- & continue to monitor & will seek care if needed. I will call him tomorrow & assess his condition and forward this info to Dr. Ulice Bold for review. Pt is reminded to call for any other concerns/changes. He understands and agrees with plan.

## 2020-07-20 ENCOUNTER — Other Ambulatory Visit: Payer: Self-pay

## 2020-07-20 ENCOUNTER — Other Ambulatory Visit: Payer: Self-pay | Admitting: *Deleted

## 2020-07-20 DIAGNOSIS — R7989 Other specified abnormal findings of blood chemistry: Secondary | ICD-10-CM

## 2020-07-20 DIAGNOSIS — E782 Mixed hyperlipidemia: Secondary | ICD-10-CM

## 2020-07-20 DIAGNOSIS — I2510 Atherosclerotic heart disease of native coronary artery without angina pectoris: Secondary | ICD-10-CM

## 2020-07-21 LAB — COMPREHENSIVE METABOLIC PANEL
ALT: 30 IU/L (ref 0–44)
AST: 21 IU/L (ref 0–40)
Albumin/Globulin Ratio: 1.6 (ref 1.2–2.2)
Albumin: 4.2 g/dL (ref 3.8–4.9)
Alkaline Phosphatase: 137 IU/L — ABNORMAL HIGH (ref 44–121)
BUN/Creatinine Ratio: 9 (ref 9–20)
BUN: 9 mg/dL (ref 6–24)
Bilirubin Total: 0.4 mg/dL (ref 0.0–1.2)
CO2: 22 mmol/L (ref 20–29)
Calcium: 9.2 mg/dL (ref 8.7–10.2)
Chloride: 106 mmol/L (ref 96–106)
Creatinine, Ser: 1.02 mg/dL (ref 0.76–1.27)
GFR calc Af Amer: 97 mL/min/{1.73_m2} (ref >59)
GFR calc non Af Amer: 84 mL/min/{1.73_m2} (ref >59)
Globulin, Total: 2.7 g/dL (ref 1.5–4.5)
Glucose: 91 mg/dL (ref 65–99)
Potassium: 4.7 mmol/L (ref 3.5–5.2)
Sodium: 142 mmol/L (ref 134–144)
Total Protein: 6.9 g/dL (ref 6.0–8.5)

## 2020-07-21 LAB — LIPID PANEL
Chol/HDL Ratio: 3.3 ratio (ref 0.0–5.0)
Cholesterol, Total: 101 mg/dL (ref 100–199)
HDL: 31 mg/dL — ABNORMAL LOW (ref >39)
LDL Chol Calc (NIH): 53 mg/dL (ref 0–99)
Triglycerides: 86 mg/dL (ref 0–149)
VLDL Cholesterol Cal: 17 mg/dL (ref 5–40)

## 2020-07-24 ENCOUNTER — Telehealth: Payer: Self-pay | Admitting: Nurse Practitioner

## 2020-07-24 ENCOUNTER — Telehealth: Payer: Self-pay

## 2020-07-24 DIAGNOSIS — R7989 Other specified abnormal findings of blood chemistry: Secondary | ICD-10-CM

## 2020-07-24 NOTE — Telephone Encounter (Signed)
-----  Message from Liliane Shi, [place] sent at 07/23/2020  7:40 AM EDT ----- Creatinine, K+, AST/ALT normal.  Alk Phos mildly elevated.  LDL optimal.  PLAN:  - Continue current medications. - Repeat fasting LFTs, GGT in 1 month (to evaluate elevated alk phos) Richardson Dopp, PA-C    07/23/2020 7:20 AM

## 2020-07-24 NOTE — Telephone Encounter (Signed)
Reviewed results and plan of care with patient who verbalized understanding and agreement. He is scheduled for repeat lab work in 1 month. He states he generally fasts daily and does not eat until later afternoon. I advised him to try to come in earlier in the day fasting and well hydrated due to prolonged fasting can elevated alk phos. He verbalized agreement.

## 2020-07-24 NOTE — Telephone Encounter (Signed)
Call to pt- no answer/ left v/m requesting call back for status update

## 2020-07-25 NOTE — Telephone Encounter (Signed)
Pt returned my call & left a message with office staff- stating that his symptoms have improved- he has a f/u visit with Dr. Ulice Bold on 08/07/20.

## 2020-07-30 MED FILL — METOPROLOL TARTRATE 25 MG T: 25 | 90 days supply | Qty: 180 | Fill #3

## 2020-07-30 MED FILL — LOSARTAN POTASSIUM 25 MG TA: 25 | 90 days supply | Qty: 90 | Fill #3

## 2020-07-30 MED FILL — ATORVASTATIN 80 MG TABLET: 80 | 90 days supply | Qty: 90 | Fill #3

## 2020-08-07 ENCOUNTER — Ambulatory Visit (INDEPENDENT_AMBULATORY_CARE_PROVIDER_SITE_OTHER): Payer: Self-pay | Admitting: Surgical

## 2020-08-07 ENCOUNTER — Encounter: Payer: Self-pay | Admitting: Surgical

## 2020-08-07 ENCOUNTER — Other Ambulatory Visit: Payer: Self-pay

## 2020-08-07 ENCOUNTER — Other Ambulatory Visit: Payer: Self-pay | Admitting: Surgical

## 2020-08-07 VITALS — BP 128/78 | HR 56 | Temp 98.1°F

## 2020-08-07 DIAGNOSIS — S81801D Unspecified open wound, right lower leg, subsequent encounter: Secondary | ICD-10-CM

## 2020-08-07 MED ORDER — DOXYCYCLINE HYCLATE 100 MG PO TABS: 100.0000 mg | ORAL_TABLET | Freq: Two times a day (BID) | ORAL | 0 refills | Status: DC

## 2020-08-07 MED FILL — DOXYCYCLINE HYCLATE 100 MG: 100 | 7 days supply | Qty: 14 | Fill #0

## 2020-08-07 NOTE — Progress Notes (Addendum)
Subjective:     Patient ID: Kevin [name], male    DOB: 12/15/1965, 54 y.o.   MRN: [id number]  Chief Complaint  Patient presents with  . Follow-up    HPI: The patient is a 54 y.o. male here for follow-up on his right leg wound. Patient fell from a ladder in early September and sustained the trauma to the right anterior leg. He has been covering the wound with a nonstick pad and covering with his silver compression socks.  He reports that he is not having any infectious symptoms, he has not had much pain from the wound, he does not have any numbness or tingling of the lower extremity, he has normal sensation of his lower extremity.  He feels as if the redness of his right lower leg has improved. He is not having any chest pain, shortness of breath.  He is a prediabetic, however last A1c in EMR is from 04/05/2019 which was 6.4  Review of Systems  Constitutional: Negative for activity change, chills, fatigue and fever.  Respiratory: Negative for chest tightness and shortness of breath.   Cardiovascular: Positive for leg swelling. Negative for chest pain and palpitations.  Gastrointestinal: Negative.   Skin: Positive for color change and wound.  Neurological: Negative.      Objective:   Vital Signs BP 128/78 (BP Location: Left Arm, Patient Position: Sitting, Cuff Size: Large)   Pulse (!) 56   Temp 98.1 F (36.7 C) (Oral)   SpO2 98%  Vital Signs and Nursing Note Reviewed Chaperone present Physical Exam Constitutional:      General: He is not in acute distress.    Appearance: He is not ill-appearing, toxic-appearing or diaphoretic.  HENT:     Head: Normocephalic and atraumatic.  Cardiovascular:     Rate and Rhythm: Normal rate.     Pulses: Normal pulses.  Pulmonary:     Effort: Pulmonary effort is normal.  Musculoskeletal:        General: Swelling and signs of injury present. No tenderness.     Right lower leg: Edema present.     Left lower leg: No edema.      Comments: 2+ DP pulse noted, no tenderness to palpation of right lower extremity, no tenderness with palpation to calf, no pitting edema noted  Neurological:     General: No focal deficit present.     Mental Status: He is alert and oriented to person, place, and time. Mental status is at baseline.  Psychiatric:        Mood and Affect: Mood normal.        Behavior: Behavior normal.    Right lower extremity wound: Right lower extremity wound that is approximately 3 x 1 x 1 cm with fibrinous exudate noted within the wound bed. I was able to palpate some fat necrosis from the superior portion of the wound. This had a foul odor. There is no crepitus with palpation. The drainage appeared to be fat necrosis, it did not appear to have too much purulence.There was some fibrinous exudate that was a little bit necrotic. No fluctuance palpated. Erythema has improved based on previous EMR photo review.  He did have some tracking superiorly where the fat necrosis was expressed.  Tracking superiorly was approximately 2 cm.     Assessment/Plan:     ICD-10-CM   1. Wound of right lower extremity, subsequent encounter  S81.801D     Patient provided with Xeroform, I did pack the wound today with  packing gauze. I recommend he remove the packing gauze in 24 hours then begin Xeroform dressing changes daily. I recommend Vaseline to the superficial wounds surrounding the deep most prominent wound on the anterior leg. I prescribed him doxycycline due to the foul odor. The area surrounding the right leg wound is very erythematous, this is slightly improved since his last visit. He reports that he has not been on antibiotics since the oral antibiotics prescribed by him after discharge from the hospital. He reports that he has not had any infectious symptoms. He has not had any symptoms of VTE. He denies any chest pain or shortness of breath.  I discussed with the patient that I would recommend excision of the fibrinous  exudate and wound exploration due to the tracking of the wound superiorly, however I would discuss this further with Dr. Ulice Bold to see if she is in agreement with the plan.  Patient had a 2+ DP pulse noted on exam, no pitting edema noted, he did have a little bit of fat necrosis that was expressed from the superior portion of the wound, I do not feel as if he has any compartment syndrome as he has normal sensation of his distal extremity. He does have minimal pain to the area which is a little bit concerning, however he reports that pain has been relatively well controlled throughout the entire process.  Recommend calling with questions or concerns. Follow-up scheduled for 1 to 2 weeks.  Pictures were obtained of the patient and placed in the chart with the patient's or guardian's permission.  Addendum: Final culture reports received, culture showed Enterobacter cloacae and Staph aureus which is sensitive to multiple antibiotics.  Will call patient and prescribe Bactrim.  Will have patient DC doxycycline.   Kermit Balo Dashawn Golda, PA-C 08/07/2020, 3:38 PM

## 2020-08-08 NOTE — Addendum Note (Signed)
Addended byKeenan Bachelor on: 08/08/2020 10:32 AM   Modules accepted: Orders

## 2020-08-10 ENCOUNTER — Other Ambulatory Visit: Payer: Self-pay | Admitting: Surgical

## 2020-08-10 MED ORDER — SULFAMETHOXAZOLE-TRIMETHOPRIM 800-160 MG PO TABS: 1.0000 | ORAL_TABLET | Freq: Two times a day (BID) | ORAL | 0 refills | Status: DC

## 2020-08-10 MED FILL — SULFAMETHOXAZOLE-TMP DS TAB: 800-160 | 10 days supply | Qty: 20 | Fill #0

## 2020-08-10 NOTE — Addendum Note (Signed)
Addended byKeenan Bachelor on: 08/10/2020 10:17 AM   Modules accepted: Orders

## 2020-08-13 LAB — AEROBIC/ANAEROBIC CULTURE W GRAM STAIN (SURGICAL/DEEP WOUND)

## 2020-08-17 ENCOUNTER — Ambulatory Visit (INDEPENDENT_AMBULATORY_CARE_PROVIDER_SITE_OTHER): Payer: Self-pay | Admitting: Surgical

## 2020-08-17 ENCOUNTER — Encounter: Payer: Self-pay | Admitting: Surgical

## 2020-08-17 ENCOUNTER — Other Ambulatory Visit: Payer: Self-pay

## 2020-08-17 ENCOUNTER — Telehealth: Payer: Self-pay | Admitting: Surgical

## 2020-08-17 VITALS — BP 117/72 | HR 52 | Temp 98.1°F

## 2020-08-17 DIAGNOSIS — S81801D Unspecified open wound, right lower leg, subsequent encounter: Secondary | ICD-10-CM

## 2020-08-17 NOTE — Telephone Encounter (Signed)
Spoke with [facility] regarding the bandage not holding with the Acell and slid down his leg. He suggested to use the adaptic, ky-jelly, gauze, and tape and use his compression sock. If he runs out of adaptic before his follow up appointment to use the xerform. Patient understood and agreed.

## 2020-08-17 NOTE — Telephone Encounter (Signed)
Patient said that the medicine the doctor put on his wound has come off because the wrap kept sliding up and down his leg. He doesn't have that medicine at home and wants to know should he come back in to have more put on or should he do something else.

## 2020-08-17 NOTE — Progress Notes (Signed)
Patient is a 54 year old male here for follow-up on his right leg wound.  He had fallen from a ladder in early September and sustained the trauma to the right anterior portion of his leg.  He last saw me on 08/07/2020 and I packed the wound with packing gauze and expressed a fair amount of fat necrosis and purulent drainage from the wound.  The area was cultured and grew Enterobacter cloacae and staph aureus.  I prescribed him some Bactrim after cultures resulted for 10 days.  He reports that he has been applying the Xeroform gauze to the wound and has noticed this has been helpful.  On exam right leg wound with periwound irritation and redness noted.  No foul odor is noted.  The wound bed has significantly improved.  There is no more tunneling superiorly.  The wound is approximately 2 x 0.5 cm without any tunneling.  There is no cellulitic changes.  No purulence or fluctuance noted.  Donated ACell was applied to the right lower extremity wound.  I discussed the wound care regimen with the patient.  I recommend that he apply K-Y jelly daily, change the Adaptic mesh every other day and cover with 4 x 4 gauze, Kerlix.  After he runs out of Adaptic mesh, I recommend he begin using plain K-Y jelly, Vaseline or Xeroform for dressing changes daily.  I discussed with the patient that would like to see him back in 1 week for reevaluation.  We have canceled the surgery as he has made significant improvement since I had last seen him.  I recommend he call us with any question or concerns.  Pictures were obtained of the patient and placed in the chart with the patient's or guardian's permission.

## 2020-08-20 ENCOUNTER — Other Ambulatory Visit (HOSPITAL_COMMUNITY): Payer: Self-pay

## 2020-08-22 ENCOUNTER — Ambulatory Visit: Admission: RE | Admit: 2020-08-22 | Payer: Self-pay | Source: Home / Self Care | Admitting: Plastic Surgery

## 2020-08-22 ENCOUNTER — Encounter: Admission: RE | Payer: Self-pay | Source: Home / Self Care

## 2020-08-22 SURGERY — IRRIGATION AND DEBRIDEMENT EXTREMITY
Anesthesia: General | Site: Leg Lower | Laterality: Right

## 2020-08-24 ENCOUNTER — Other Ambulatory Visit: Payer: Self-pay | Admitting: *Deleted

## 2020-08-24 ENCOUNTER — Other Ambulatory Visit: Payer: Self-pay

## 2020-08-24 DIAGNOSIS — R7989 Other specified abnormal findings of blood chemistry: Secondary | ICD-10-CM

## 2020-08-24 LAB — HEPATIC FUNCTION PANEL
ALT: 42 IU/L (ref 0–44)
AST: 29 IU/L (ref 0–40)
Albumin: 4.5 g/dL (ref 3.8–4.9)
Alkaline Phosphatase: 131 IU/L — ABNORMAL HIGH (ref 44–121)
Bilirubin Total: 0.4 mg/dL (ref 0.0–1.2)
Bilirubin, Direct: 0.14 mg/dL (ref 0.00–0.40)
Total Protein: 7.4 g/dL (ref 6.0–8.5)

## 2020-08-24 LAB — GAMMA GT: GGT: 42 IU/L (ref 0–65)

## 2020-08-27 ENCOUNTER — Ambulatory Visit (INDEPENDENT_AMBULATORY_CARE_PROVIDER_SITE_OTHER): Payer: Self-pay | Admitting: Surgical

## 2020-08-27 ENCOUNTER — Other Ambulatory Visit: Payer: Self-pay

## 2020-08-27 ENCOUNTER — Encounter: Payer: Self-pay | Admitting: Surgical

## 2020-08-27 VITALS — BP 126/83 | HR 56 | Temp 97.7°F

## 2020-08-27 DIAGNOSIS — S81801D Unspecified open wound, right lower leg, subsequent encounter: Secondary | ICD-10-CM

## 2020-08-27 NOTE — Progress Notes (Signed)
Patient is a 54 year old male here for follow-up on his right leg wound that he sustained after falling from a ladder in early September of this year.  He was initially scheduled for irrigation and debridement of the wound, however after his office visit on 08/07/2020 he had a significant improvement in the wound after expressing a fair amount of fat necrosis from the leg.  Patient reports he is doing well. After his last appointment he reports that the ACell had fallen off the same day. He reports he has been applying Vaseline to the area. He has been covering it with a large square Band-Aid. The adhesive of the Band-Aid appears to have caused some superficial skin breakdown at the border. He continues to have the chronic erythema of the right lower leg surrounding the wound. He reports pain is under control. He reports normal sensation in his lower extremity.  On exam he has a good base of granulation tissue within the wound bed. There is no cellulitic changes. No foul odor is noted. The wound is approximately 1 x 0.4 cm.  Provided patient with collagen dressing samples today to use. Discussed with patient to change this daily or every other day depending on the speed of incorporation. I discussed with him what that would look like and discussed with him to use K-Y jelly or sterile water to moisten the collagen. He can then cover it with a Band-Aid or gauze and tape. Recommend following up in 2 weeks for reevaluation. If he runs out of collagen prior to 2 weeks, recommend using Vaseline daily.  Call with questions or concerns.

## 2020-08-28 ENCOUNTER — Encounter: Payer: Self-pay | Admitting: Surgical

## 2020-09-10 ENCOUNTER — Encounter: Payer: Self-pay | Admitting: Surgical

## 2020-09-10 ENCOUNTER — Other Ambulatory Visit: Payer: Self-pay

## 2020-09-10 ENCOUNTER — Ambulatory Visit (INDEPENDENT_AMBULATORY_CARE_PROVIDER_SITE_OTHER): Payer: Self-pay | Admitting: Surgical

## 2020-09-10 VITALS — BP 126/75 | HR 67 | Temp 98.2°F

## 2020-09-10 DIAGNOSIS — S81801D Unspecified open wound, right lower leg, subsequent encounter: Secondary | ICD-10-CM

## 2020-09-10 NOTE — Progress Notes (Signed)
Patient is a 54 year old male here for follow-up on his right leg wound that he sustained after falling from a ladder in September 2021.  Patient reports he has been using the collagen for dressing changes daily.  He reports he has been covering it with a nonstick and then tapering the nonstick in place.  He reports that he has been overall doing pretty well.  He reports that sometimes after being sedentary for quite some time he notices some stiffness in his heel.  He reports that after ambulating this resolves.  He reports that he has been spraying the wound with wound cleanser that his friend provided him with.  On exam right lower extremity wound is showing some improvement.  The periphery is less erythematous.  It does still appear red and irritated, but this is improving.  The wound is approximately 1.5 x 0.4 cm and flush with the surrounding skin.  It appears he has had some epithelialization since the last visit.  No foul odors or purulence noted.  Recommend continuing with collagen dressing changes daily or every other day depending on his showering schedule.  Recommend wearing the compression sock daily, be sure to clean the sock between uses.  Recommend elevating the leg as able.   Recommend following up in 3 weeks for reevaluation.  Call with any questions or concerns.  There is no sign of infection. Pictures were obtained of the patient and placed in the chart with the patient's or guardian's permission.

## 2020-10-02 ENCOUNTER — Ambulatory Visit (INDEPENDENT_AMBULATORY_CARE_PROVIDER_SITE_OTHER): Payer: Self-pay | Admitting: Surgical

## 2020-10-02 ENCOUNTER — Other Ambulatory Visit: Payer: Self-pay

## 2020-10-02 ENCOUNTER — Encounter: Payer: Self-pay | Admitting: Surgical

## 2020-10-02 VITALS — BP 136/88 | HR 57 | Temp 97.9°F

## 2020-10-02 DIAGNOSIS — S81801D Unspecified open wound, right lower leg, subsequent encounter: Secondary | ICD-10-CM

## 2020-10-02 NOTE — Progress Notes (Signed)
Patient is a 54 year old male here for follow-up on his right leg wound that he sustained after falling from a ladder in September 2021.  Patient reports he has been applying collagen for the past few weeks.  He reports however that it has scabbed over and he has not been applying collagen since.  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  He reports that he continues to have some tenderness and stiffness in his heel, however reports that he has been ambulating normally and reports normal strength in his lower extremity.  He continues to have normal sensation.   On exam right lower extremity wound with scabbing noted over the entirety of the wound.  The periphery is still erythematous, but it is improved from the previous visit.  He continues to have some chronic darkening of the skin due to the inflammation from the injury.  No foul odor is noted.  No fluctuance or purulence noted with palpation.  Nontender to palpation.  Wound is approximately 1.7 x 0.4 cm.     Recommend Vaseline and gauze daily to the wound, this will help soften up the scab.  I recommend he continue with this, if he notices that the scab comes off and there is exposed beefy red tissue, I recommend he continue applying the collagen every other day.  I recommend that he keep this covered while at work to prevent an infection.  I recommend he call me with any questions or concerns, we will schedule a follow-up in 1 month for reevaluation.  I discussed with him that if he has any questions prior to his scheduled appointment that he is welcome to call us.  He seemed understanding of the plan.  Pictures were obtained of the patient and placed in the chart with the patient's or guardian's permission.

## 2020-11-02 ENCOUNTER — Other Ambulatory Visit: Payer: Self-pay | Admitting: Cardiovascular Disease

## 2020-11-02 ENCOUNTER — Other Ambulatory Visit: Payer: Self-pay | Admitting: Physician Assistant

## 2020-11-02 MED FILL — METOPROLOL TARTRATE 25 MG T: 25 | 90 days supply | Qty: 180 | Fill #0

## 2020-11-02 MED FILL — ATORVASTATIN 80 MG TABLET: 80 | 90 days supply | Qty: 90 | Fill #0

## 2020-11-02 MED FILL — LOSARTAN POTASSIUM 25 MG TA: 25 | 30 days supply | Qty: 30 | Fill #0

## 2020-11-07 ENCOUNTER — Ambulatory Visit (INDEPENDENT_AMBULATORY_CARE_PROVIDER_SITE_OTHER): Payer: Self-pay | Admitting: Surgical

## 2020-11-07 ENCOUNTER — Encounter: Payer: Self-pay | Admitting: Surgical

## 2020-11-07 ENCOUNTER — Other Ambulatory Visit: Payer: Self-pay

## 2020-11-07 VITALS — BP 152/95 | HR 59

## 2020-11-07 DIAGNOSIS — S81801D Unspecified open wound, right lower leg, subsequent encounter: Secondary | ICD-10-CM

## 2020-11-07 NOTE — Progress Notes (Signed)
   Subjective:     Patient ID: Kevin [name], male    DOB: [birth year], 55 y.o.   MRN: [id number]  Chief Complaint  Patient presents with  . Follow-up    HPI: The patient is a 55 y.o. male here for follow-up on his right leg wound that he sustained after falling from a ladder in September 2021.  At his last visit he had a small scab that I recommended he started using Vaseline on.  He reports he is doing well today.  The wound has completely epithelialized and healed.  There is no exposed tissue or open wounds at this point.  He reports that he has noticed a significant improvement in his healing as well and his comfort.  He reports that he is still having some occasional nerve pain but otherwise is doing well.  He reports he is walking a lot better, has normal sensation in his distal extremity and his toes and his pleased with his healing process.  No infectious symptoms  Review of Systems  Constitutional: Negative.   Respiratory: Negative.   Cardiovascular: Negative.   Musculoskeletal: Negative.   Skin: Negative.      Objective:   Vital Signs BP (!) 152/95 (BP Location: Left Arm, Patient Position: Sitting, Cuff Size: Large)   Pulse (!) 59   SpO2 98%  Vital Signs and Nursing Note Reviewed Physical Exam Constitutional:      General: He is not in acute distress.    Appearance: Normal appearance. He is not ill-appearing.  HENT:     Head: Normocephalic and atraumatic.  Pulmonary:     Effort: Pulmonary effort is normal.  Skin:    General: Skin is warm and dry.     Capillary Refill: Capillary refill takes less than 2 seconds.       Neurological:     General: No focal deficit present.     Mental Status: He is alert.  Psychiatric:        Mood and Affect: Mood normal.        Behavior: Behavior normal.    Right lower extremity:       Assessment/Plan:     ICD-10-CM   1. Wound of right lower extremity, subsequent encounter  S81.801D     Patient presents for  follow-up on his right lower extremity wound after falling from a ladder in September 2021.  The wound has completely healed at this point.  Recommend applying Vaseline for the next 2 to 3 weeks to help keep the area moisturized.   There is no surrounding erythema.  There is some chronic skin changes noted, however this is improving.  He is ambulating normally now.  He has no complaints or deficits that he has noticed.  I discussed with the patient that the shocklike sensation he is noticing in his right lower extremity is likely nerve related and will continue to improve over time.  He is understanding of this.  Recommend calling with questions or concerns. Follow up as needed. Pictures were obtained of the patient and placed in the chart with the patient's or guardian's permission.  No sign of infection.   Kermit Balo Tiler Brandis, PA-C 11/07/2020, 2:36 PM

## 2021-02-15 ENCOUNTER — Other Ambulatory Visit (HOSPITAL_COMMUNITY): Payer: Self-pay

## 2021-02-15 MED FILL — Atorvastatin Calcium Tab 80 MG (Base Equivalent): ORAL | 90 days supply | Qty: 90 | Fill #0 | Status: AC

## 2021-02-15 MED FILL — Metoprolol Tartrate Tab 25 MG: ORAL | 90 days supply | Qty: 180 | Fill #0 | Status: AC

## 2021-07-17 IMAGING — CR DG TIBIA/FIBULA 2V*R*
4 series · 4 of 4 positions shown · non-contrast
Comparison: None.

CLINICAL DATA: Fall

EXAM:
RIGHT TIBIA AND FIBULA - 2 VIEW

[x tib-fib ap right]
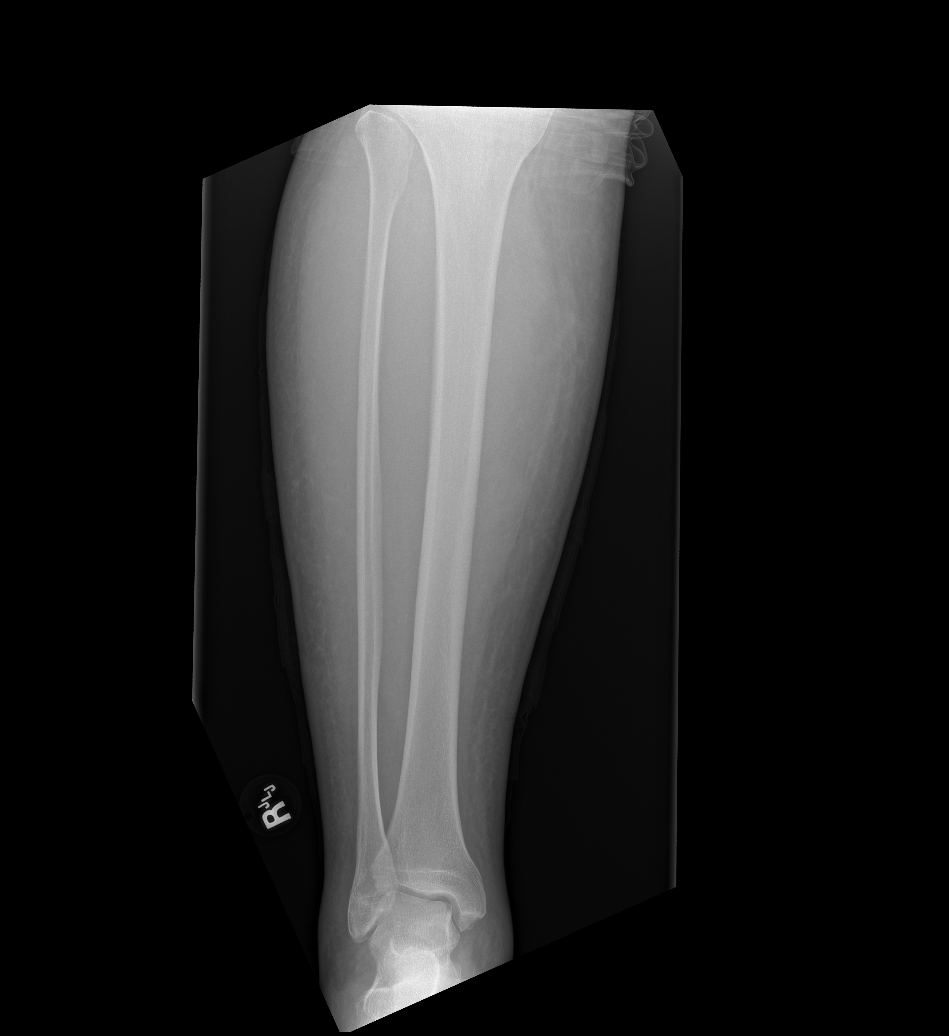

[x tib-fib lat right (1 of 3)]
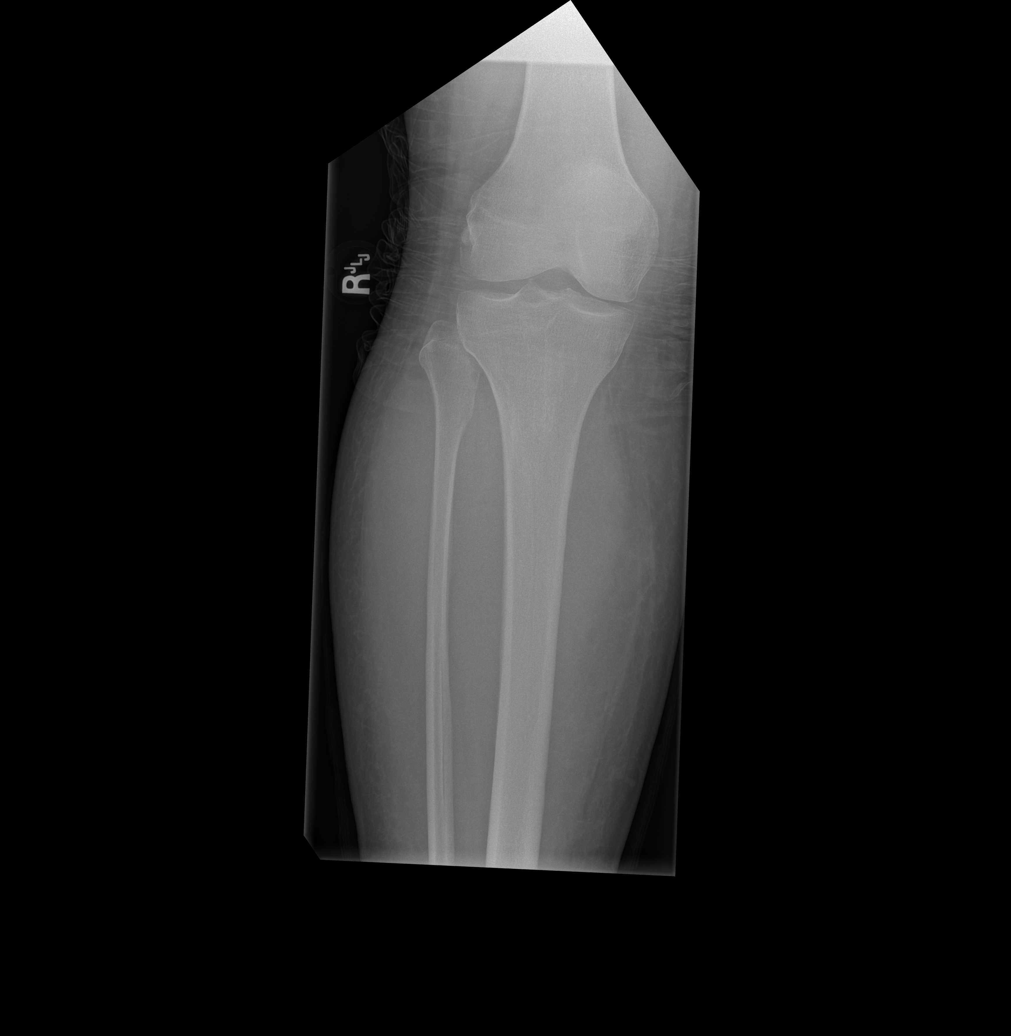

[x tib-fib lat right (2 of 3)]
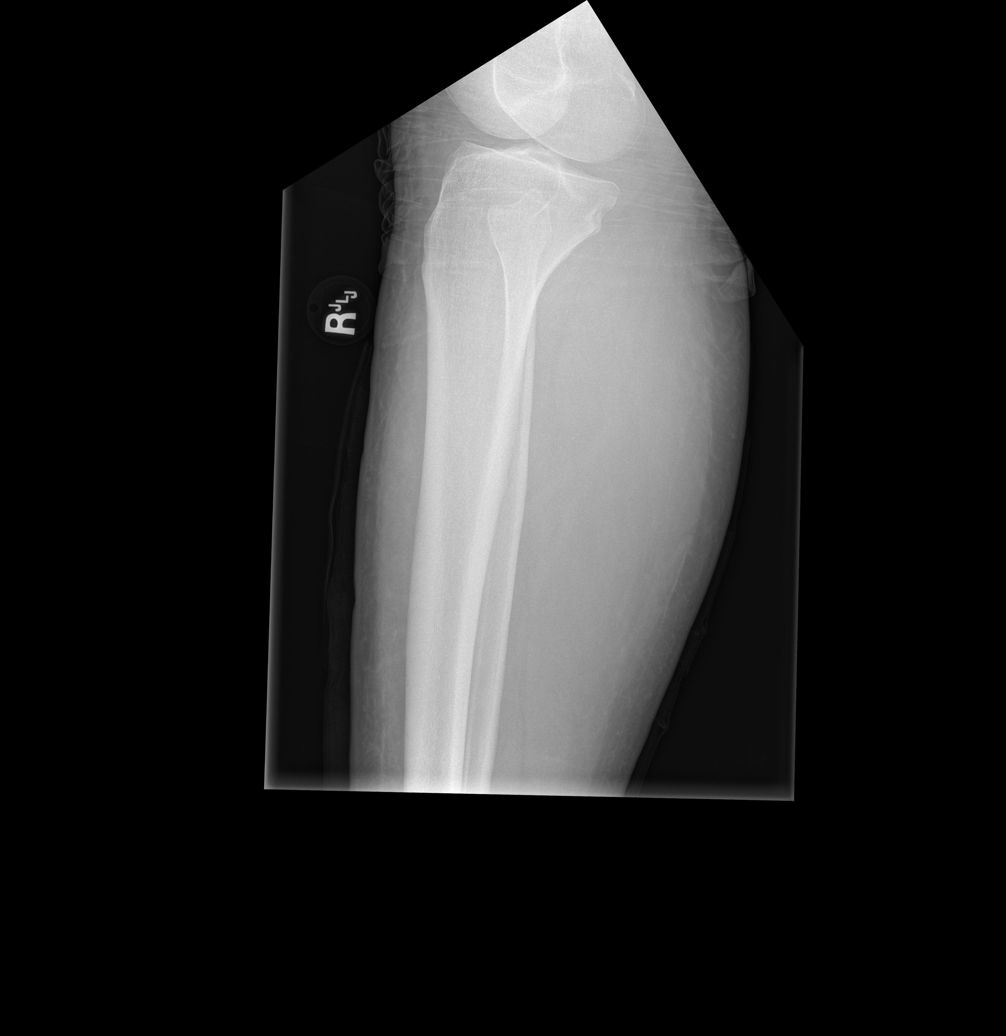

[x tib-fib lat right (3 of 3)]
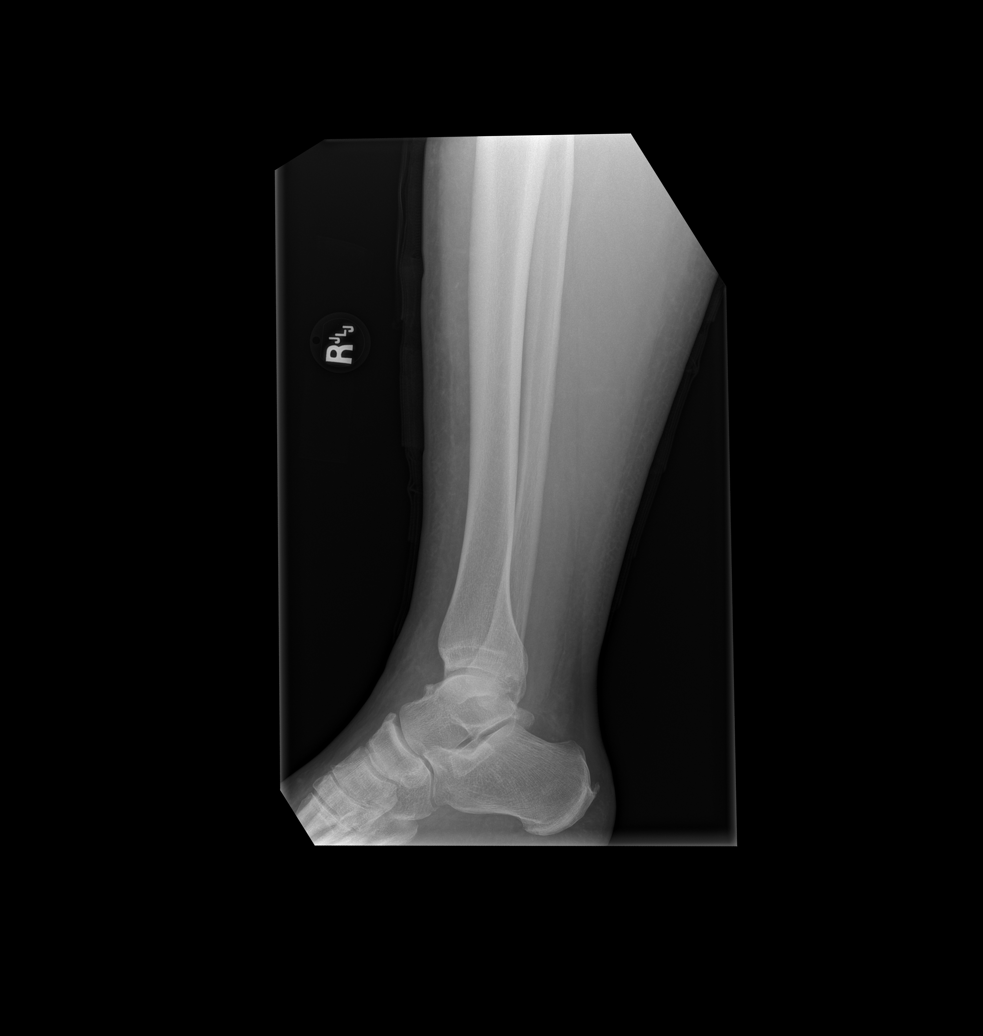

[4 of 4 positions shown; findings below may reference images not displayed]

FINDINGS: No fracture or malalignment. No periostitis. Diffuse soft tissue
edema.
IMPRESSION: No acute osseous abnormality.

## 2021-08-09 ENCOUNTER — Other Ambulatory Visit: Payer: Self-pay | Admitting: *Deleted

## 2021-08-19 ENCOUNTER — Other Ambulatory Visit: Payer: Self-pay | Admitting: Cardiovascular Disease

## 2021-08-19 ENCOUNTER — Other Ambulatory Visit (HOSPITAL_COMMUNITY): Payer: Self-pay

## 2021-08-19 MED ORDER — METOPROLOL TARTRATE 25 MG PO TABS
25.0000 mg | ORAL_TABLET | Freq: Two times a day (BID) | ORAL | 0 refills | Status: DC
  Filled 2021-08-19: qty 60, 30d supply, fill #0

## 2021-08-19 MED ORDER — ATORVASTATIN CALCIUM 80 MG PO TABS
80.0000 mg | ORAL_TABLET | Freq: Every day | ORAL | 0 refills | Status: DC
  Filled 2021-08-19: qty 30, 30d supply, fill #0

## 2021-08-19 MED ORDER — METOPROLOL TARTRATE 25 MG PO TABS
25.0000 mg | ORAL_TABLET | Freq: Two times a day (BID) | ORAL | 1 refills | Status: DC
Start: 2021-08-19 — End: 2022-02-05
  Filled 2021-08-19 (×2): qty 180, 90d supply, fill #0
  Filled 2021-12-31: qty 180, 90d supply, fill #1

## 2021-08-19 NOTE — Telephone Encounter (Signed)
Called patient, made patient an appointment with Tereso Newcomer PA 11/19/21, first available.

## 2021-11-19 ENCOUNTER — Ambulatory Visit: Payer: Self-pay | Admitting: Physician Assistant

## 2021-12-31 ENCOUNTER — Other Ambulatory Visit: Payer: Self-pay | Admitting: Cardiovascular Disease

## 2021-12-31 ENCOUNTER — Other Ambulatory Visit (HOSPITAL_COMMUNITY): Payer: Self-pay

## 2021-12-31 MED ORDER — ATORVASTATIN CALCIUM 80 MG PO TABS
80.0000 mg | ORAL_TABLET | Freq: Every day | ORAL | 0 refills | Status: DC
  Filled 2021-12-31: qty 30, 30d supply, fill #0

## 2021-12-31 MED ORDER — NITROGLYCERIN 0.4 MG SL SUBL
0.4000 mg | SUBLINGUAL_TABLET | SUBLINGUAL | 3 refills | Status: DC | PRN
  Filled 2021-12-31: qty 25, 30d supply, fill #0

## 2022-01-28 ENCOUNTER — Ambulatory Visit: Payer: Self-pay | Admitting: Physician Assistant

## 2022-02-04 NOTE — Progress Notes (Signed)
?Cardiology Office Note:   ? ?Date:  02/05/2022  ? ?ID:  Kevin [name], DOB [birth year], MRN [id number] ? ?PCP:  Patient, No Pcp Per (Inactive)  ?[facility] Providers ?Cardiologist:  Sherren Mocha, MD    ?Referring MD: No ref. provider found  ? ?Chief Complaint:  Follow-up for CAD ?  ? ?Patient Profile: ?Coronary artery disease  ?S/p NSTEMI 03/2019 >> PCI:  DES to pLAD ?Hyperlipidemia  ?Hypertension  ?Prediabetes  ?  ?Prior CV Studies: ?Cardiac catheterization 04/05/2019  ?LAD ost 95, mid 30, dist 50 ?OM1 50, OM2 70 ?RCA prox 50 ?EF 65 ?PCI:  3 x 12 Synergy DES to ost LAD ? ?  ? ?History of Present Illness:   ?Kevin [name] is a 56 y.o. male with the above problem list.  He was last seen in July 2021.  He returns for f/u.  He is here alone.  He notes shortness of breath after certain meals.  He may have some indigestion.  He has not had exertional chest pain or shortness of breath.  He has not had orthopnea, syncope.  He has some mild pedal edema without significant change.     ?   ?Past Medical History:  ?Diagnosis Date  ? Coronary artery disease   ? a. 04/05/19 NSTEMI, DES to pLAD,   ? Hyperlipidemia LDL goal <70   ? Hypertension   ? NSTEMI (non-ST elevated myocardial infarction) ([place]) 04/05/2019  ? Pre-diabetes   ? ?Current Medications: ?Current Meds  ?Medication Sig  ? aspirin 81 MG chewable tablet Chew 1 tablet (81 mg total) by mouth daily.  ? atorvastatin (LIPITOR) 80 MG tablet Take 1 tablet (80 mg total) by mouth daily at 6pm  ? metoprolol tartrate (LOPRESSOR) 25 MG tablet Take 0.5 tablets (12.5 mg total) by mouth 2 (two) times daily.  ? nitroGLYCERIN (NITROSTAT) 0.4 MG SL tablet Place 1 tablet (0.4 mg total) under the tongue every 5 (five) minutes for 3 doses as needed for chest pain  ? vitamin C (ASCORBIC ACID) 500 MG tablet Take 500 mg by mouth 2 (two) times daily.   ? [DISCONTINUED] metoprolol tartrate (LOPRESSOR) 25 MG tablet Take 1 tablet (25 mg total) by mouth 2 (two) times daily.  ?  ?Allergies:    Patient has no known allergies.  ? ?Social History  ? ?Tobacco Use  ? Smoking status: Former  ?  Types: Cigarettes  ?  Quit date: 10/06/2006  ?  Years since quitting: 15.3  ? Smokeless tobacco: Never  ?Vaping Use  ? Vaping Use: Never used  ?Substance Use Topics  ? Alcohol use: Not Currently  ?  Comment:    ? Drug use: No  ?  ?Family Hx: ?The patient's family history includes Bladder Cancer in his father; CAD in his maternal grandfather and paternal grandmother; Diabetes in his father, maternal grandmother, and paternal grandmother; Hyperlipidemia in his mother; Hypertension in his father, maternal grandfather, and maternal grandmother; Kidney disease in his maternal grandmother. ? ?Review of Systems  ?Constitutional: Positive for malaise/fatigue.   ? ?EKGs/Labs/Other Test Reviewed:   ? ?EKG:  EKG is  ordered today.  The ekg ordered today demonstrates sinus bradycardia, HR 56, normal axis, no ST-T wave changes, QTc 420 ? ?Recent Labs: ?No results found for requested labs within last 8760 hours.  ? ?Recent Lipid Panel ?No results for input(s): CHOL, TRIG, HDL, VLDL, LDLCALC, LDLDIRECT in the last 8760 hours.  ? ?Risk Assessment/Calculations:   ?  ?    ?Physical  Exam:   ? ?VS:  BP 124/82   Pulse (!) 56   Ht '6\' 1"'  (1.854 m)   Wt 285 lb (129.3 kg)   SpO2 98%   BMI 37.60 kg/m?    ? ?Wt Readings from Last 3 Encounters:  ?02/05/22 285 lb (129.3 kg)  ?06/22/20 285 lb (129.3 kg)  ?06/13/20 280 lb (127 kg)  ?  ?Constitutional:   ?   Appearance: Healthy appearance. Not in distress.  ?Neck:  ?   Vascular: No carotid bruit or JVR. JVD normal.  ?Pulmonary:  ?   Effort: Pulmonary effort is normal.  ?   Breath sounds: No wheezing. No rales.  ?Cardiovascular:  ?   Normal rate. Regular rhythm. Normal S1. Normal S2.   ?   Murmurs: There is no murmur.  ?Edema: ?   Peripheral edema absent.  ?Abdominal:  ?   Palpations: Abdomen is soft. There is no hepatomegaly.  ?Skin: ?   General: Skin is warm and dry.  ?Neurological:  ?   General:  No focal deficit present.  ?   Mental Status: Alert and oriented to person, place and time.  ?   Cranial Nerves: Cranial nerves are intact.  ?  ?    ?ASSESSMENT & PLAN:   ?Coronary artery disease involving native coronary artery of native heart without angina pectoris ?History of non-STEMI in 2020 treated with a DES to the proximal LAD.  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  Overall, he is doing well without anginal symptoms.  He exercises on a regular basis without chest discomfort.  His electrocardiogram does not demonstrate any acute changes.  Continue aspirin 81 mg daily, Lipitor 80 mg daily.  He does note fatigue.  I will decrease his metoprolol tartrate to 12.5 mg twice daily.  Follow-up in 6 months. ? ?SOB (shortness of breath) ?He does note shortness of breath from time to time.  He does have a history of smoking.  His EF was normal on left ventriculogram at time of his myocardial infarction.  He does not have any heart failure symptoms.  I will obtain an echocardiogram, CMET, CBC, TSH.  ? ?Fatigue ?Etiology not clear.  Obtain CMET, CBC, TSH.  I have encouraged him to establish with primary care. ? ?Essential hypertension ?Blood pressure well controlled.  Decrease metoprolol as noted due to history of fatigue.  If his blood pressure starts to increase, we will need to adjust his therapy further. ? ?Hyperlipidemia ?Continue Lipitor 80 mg daily.  Obtain CMET, fasting lipids ? ?  ?  ?     ?   ?Dispo:  Return in about 6 months (around 08/08/2022) for Routine Follow Up, w/ Dr. Burt Knack, or Richardson Dopp, PA-C.  ? ?Medication Adjustments/Labs and Tests Ordered: ?Current medicines are reviewed at length with the patient today.  Concerns regarding medicines are outlined above.  ?Tests Ordered: ?Orders Placed This Encounter  ?Procedures  ? Comp Met (CMET)  ? Lipid panel  ? CBC  ? TSH  ? EKG 12-Lead  ? ECHOCARDIOGRAM COMPLETE  ? ?Medication Changes: ?Meds ordered this encounter  ?Medications  ?  metoprolol tartrate (LOPRESSOR) 25 MG tablet  ?  Sig: Take 0.5 tablets (12.5 mg total) by mouth 2 (two) times daily.  ?  Dispense:  180 tablet  ?  Refill:  3  ? ?Signed, ?Richardson Dopp, PA-C  ?02/05/2022 5:30 PM    ?[facility] ?[address], [place], [place]  [id number] ?Phone: [phone];  Fax: [phone]  ?

## 2022-02-05 ENCOUNTER — Ambulatory Visit (INDEPENDENT_AMBULATORY_CARE_PROVIDER_SITE_OTHER): Payer: Self-pay | Admitting: Physician Assistant

## 2022-02-05 ENCOUNTER — Encounter: Payer: Self-pay | Admitting: Physician Assistant

## 2022-02-05 ENCOUNTER — Other Ambulatory Visit (HOSPITAL_COMMUNITY): Payer: Self-pay

## 2022-02-05 VITALS — BP 124/82 | HR 56 | Ht 73.0 in | Wt 285.0 lb

## 2022-02-05 DIAGNOSIS — I2510 Atherosclerotic heart disease of native coronary artery without angina pectoris: Secondary | ICD-10-CM

## 2022-02-05 DIAGNOSIS — E782 Mixed hyperlipidemia: Secondary | ICD-10-CM

## 2022-02-05 DIAGNOSIS — R0602 Shortness of breath: Secondary | ICD-10-CM

## 2022-02-05 DIAGNOSIS — R5383 Other fatigue: Secondary | ICD-10-CM | POA: Insufficient documentation

## 2022-02-05 DIAGNOSIS — I10 Essential (primary) hypertension: Secondary | ICD-10-CM

## 2022-02-05 LAB — COMPREHENSIVE METABOLIC PANEL
ALT: 63 IU/L — ABNORMAL HIGH (ref 0–44)
AST: 41 IU/L — ABNORMAL HIGH (ref 0–40)
Albumin/Globulin Ratio: 1.8 (ref 1.2–2.2)
Albumin: 4.7 g/dL (ref 3.8–4.9)
Alkaline Phosphatase: 120 IU/L (ref 44–121)
BUN/Creatinine Ratio: 9 (ref 9–20)
BUN: 10 mg/dL (ref 6–24)
Bilirubin Total: 0.4 mg/dL (ref 0.0–1.2)
CO2: 24 mmol/L (ref 20–29)
Calcium: 9.2 mg/dL (ref 8.7–10.2)
Chloride: 101 mmol/L (ref 96–106)
Creatinine, Ser: 1.06 mg/dL (ref 0.76–1.27)
Globulin, Total: 2.6 g/dL (ref 1.5–4.5)
Glucose: 132 mg/dL — ABNORMAL HIGH (ref 70–99)
Potassium: 4.7 mmol/L (ref 3.5–5.2)
Sodium: 138 mmol/L (ref 134–144)
Total Protein: 7.3 g/dL (ref 6.0–8.5)
eGFR: 83 mL/min/{1.73_m2} (ref >59)

## 2022-02-05 LAB — LIPID PANEL
Chol/HDL Ratio: 3.7 ratio (ref 0.0–5.0)
Cholesterol, Total: 144 mg/dL (ref 100–199)
HDL: 39 mg/dL — ABNORMAL LOW (ref >39)
LDL Chol Calc (NIH): 85 mg/dL (ref 0–99)
Triglycerides: 106 mg/dL (ref 0–149)
VLDL Cholesterol Cal: 20 mg/dL (ref 5–40)

## 2022-02-05 LAB — CBC
Hematocrit: 49.4 % (ref 37.5–51.0)
Hemoglobin: 17 g/dL (ref 13.0–17.7)
MCH: 29.4 pg (ref 26.6–33.0)
MCHC: 34.4 g/dL (ref 31.5–35.7)
MCV: 85 fL (ref 79–97)
Platelets: 244 10*3/uL (ref 150–450)
RBC: 5.79 x10E6/uL (ref 4.14–5.80)
RDW: 12.8 % (ref 11.6–15.4)
WBC: 10.4 10*3/uL (ref 3.4–10.8)

## 2022-02-05 LAB — TSH: TSH: 2.32 u[IU]/mL (ref 0.450–4.500)

## 2022-02-05 MED ORDER — METOPROLOL TARTRATE 25 MG PO TABS
12.5000 mg | ORAL_TABLET | Freq: Two times a day (BID) | ORAL | 3 refills | Status: DC
  Filled 2022-02-05: qty 180, 180d supply, fill #0
  Filled 2022-07-02: qty 90, 90d supply, fill #0

## 2022-02-05 NOTE — Assessment & Plan Note (Signed)
Etiology not clear.  Obtain CMET, CBC, TSH.  I have encouraged him to establish with primary care. ?

## 2022-02-05 NOTE — Assessment & Plan Note (Signed)
He does note shortness of breath from time to time.  He does have a history of smoking.  His EF was normal on left ventriculogram at time of his myocardial infarction.  He does not have any heart failure symptoms.  I will obtain an echocardiogram, CMET, CBC, TSH.  ?

## 2022-02-05 NOTE — Patient Instructions (Addendum)
Medication Instructions:  ?Your physician has recommended you make the following change in your medication:  ? REDUCE the Metoprolol to 25 mg taking 1/2 tablet twice a day ? ? ?*If you need a refill on your cardiac medications before your next appointment, please call your pharmacy* ? ? ?Lab Work: ?TODAY:  CMET, LIPID, CBC, & TSH ? ?If you have labs (blood work) drawn today and your tests are completely normal, you will receive your results only by: ?MyChart Message (if you have MyChart) OR ?A paper copy in the mail ?If you have any lab test that is abnormal or we need to change your treatment, we will call you to review the results. ? ? ?Testing/Procedures: ?Your physician has requested that you have an echocardiogram. Echocardiography is a painless test that uses sound waves to create images of your heart. It provides your doctor with information about the size and shape of your heart and how well your heart?s chambers and valves are working. This procedure takes approximately one hour. There are no restrictions for this procedure. ? ? ? ?Follow-Up: ?At [facility], you and your health needs are our priority.  As part of our continuing mission to provide you with exceptional heart care, we have created designated Provider Care Teams.  These Care Teams include your primary Cardiologist (physician) and Advanced Practice Providers (APPs -  Physician Assistants and Nurse Practitioners) who all work together to provide you with the care you need, when you need it. ? ?We recommend signing up for the patient portal called "MyChart".  Sign up information is provided on this After Visit Summary.  MyChart is used to connect with patients for Virtual Visits (Telemedicine).  Patients are able to view lab/test results, encounter notes, upcoming appointments, etc.  Non-urgent messages can be sent to your provider as well.   ?To learn more about what you can do with MyChart, go to [email or web address].   ? ?Your next  appointment:   ?6 month(s)  08/08/22 ARRIVE AT 10:15 ? ?The format for your next appointment:   ?In Person ? ?Provider:   ?Tonny Bollman, MD  or Tereso Newcomer, PA-C       ? ? ?Other Instructions ? ? ?Important Information About Sugar ? ? ? ? ?  ?

## 2022-02-05 NOTE — Assessment & Plan Note (Signed)
Blood pressure well controlled.  Decrease metoprolol as noted due to history of fatigue.  If his blood pressure starts to increase, we will need to adjust his therapy further. ?

## 2022-02-05 NOTE — Assessment & Plan Note (Signed)
Continue Lipitor 80 mg daily.  Obtain CMET, fasting lipids ?

## 2022-02-05 NOTE — Assessment & Plan Note (Signed)
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  Overall, he is doing well without anginal symptoms.  He exercises on a regular basis without chest discomfort.  His electrocardiogram does not demonstrate any acute changes.  Continue aspirin 81 mg daily, Lipitor 80 mg daily.  He does note fatigue.  I will decrease his metoprolol tartrate to 12.5 mg twice daily.  Follow-up in 6 months. ?

## 2022-02-06 ENCOUNTER — Other Ambulatory Visit (HOSPITAL_COMMUNITY): Payer: Self-pay

## 2022-02-06 ENCOUNTER — Telehealth: Payer: Self-pay | Admitting: *Deleted

## 2022-02-06 DIAGNOSIS — E782 Mixed hyperlipidemia: Secondary | ICD-10-CM

## 2022-02-06 MED ORDER — ROSUVASTATIN CALCIUM 40 MG PO TABS
40.0000 mg | ORAL_TABLET | Freq: Every day | ORAL | 3 refills | Status: DC
  Filled 2022-02-06: qty 90, 90d supply, fill #0
  Filled 2022-07-02: qty 90, 90d supply, fill #1

## 2022-02-06 NOTE — Telephone Encounter (Signed)
-----   Message from Beatrice Lecher, [place] sent at 02/06/2022  9:40 AM EDT ----- ?Glucose elevated.  Creatinine, K+, Hgb, TSH normal.  LFTs mildly elevated.  LDL cholesterol above goal. ?PLAN:  ?-Refer to primary care for evaluation and management of hyperglycemia ?-DC atorvastatin ?-Start rosuvastatin 40 mg daily ?-Lipids, LFTs in 3 months ?Tereso Newcomer, PA-C    ?02/06/2022 9:37 AM   ?

## 2022-02-11 ENCOUNTER — Other Ambulatory Visit (HOSPITAL_COMMUNITY): Payer: Self-pay

## 2022-02-25 ENCOUNTER — Ambulatory Visit (HOSPITAL_COMMUNITY): Payer: Self-pay | Attending: Cardiology

## 2022-02-25 DIAGNOSIS — I10 Essential (primary) hypertension: Secondary | ICD-10-CM | POA: Insufficient documentation

## 2022-02-25 DIAGNOSIS — R0602 Shortness of breath: Secondary | ICD-10-CM | POA: Insufficient documentation

## 2022-02-25 DIAGNOSIS — I2510 Atherosclerotic heart disease of native coronary artery without angina pectoris: Secondary | ICD-10-CM | POA: Insufficient documentation

## 2022-02-25 DIAGNOSIS — E782 Mixed hyperlipidemia: Secondary | ICD-10-CM | POA: Insufficient documentation

## 2022-02-25 LAB — ECHOCARDIOGRAM COMPLETE
Area-P 1/2: 3.61 cm2
S' Lateral: 2.9 cm

## 2022-05-09 ENCOUNTER — Other Ambulatory Visit: Payer: Self-pay

## 2022-05-09 DIAGNOSIS — E782 Mixed hyperlipidemia: Secondary | ICD-10-CM

## 2022-05-09 LAB — HEPATIC FUNCTION PANEL
ALT: 36 IU/L (ref 0–44)
AST: 23 IU/L (ref 0–40)
Albumin: 4.4 g/dL (ref 3.8–4.9)
Alkaline Phosphatase: 102 IU/L (ref 44–121)
Bilirubin Total: 0.2 mg/dL (ref 0.0–1.2)
Bilirubin, Direct: <0.1 mg/dL (ref 0.00–0.40)
Total Protein: 6.8 g/dL (ref 6.0–8.5)

## 2022-05-09 LAB — LIPID PANEL
Chol/HDL Ratio: 4 ratio (ref 0.0–5.0)
Cholesterol, Total: 116 mg/dL (ref 100–199)
HDL: 29 mg/dL — ABNORMAL LOW (ref >39)
LDL Chol Calc (NIH): 63 mg/dL (ref 0–99)
Triglycerides: 135 mg/dL (ref 0–149)
VLDL Cholesterol Cal: 24 mg/dL (ref 5–40)

## 2022-05-13 ENCOUNTER — Telehealth: Payer: Self-pay | Admitting: Licensed Clinical Social Worker

## 2022-05-13 ENCOUNTER — Other Ambulatory Visit (HOSPITAL_COMMUNITY): Payer: Self-pay

## 2022-05-13 NOTE — Telephone Encounter (Signed)
H&V Care Navigation CSW Progress Note  Clinical Social Worker contacted patient by phone to completed SDOH Screening and assess for Assistance Application eligibility. No answer at [phone], left voicemail requesting call back. Note pt with no insurance and no current PCP.  Patient is participating in a Managed Medicaid Plan:  No, self pay only  SDOH Screenings   Alcohol Screen: Not on file  Depression (IHW3-8): Not on file  Financial Resource Strain: Not on file  Food Insecurity: Not on file  Housing: Not on file  Physical Activity: Not on file  Social Connections: Not on file  Stress: Not on file  Tobacco Use: Medium Risk (02/05/2022)   Patient History    Smoking Tobacco Use: Former    Smokeless Tobacco Use: Never    Passive Exposure: Not on file  Transportation Needs: Not on file   Octavio Graves, MSW, LCSW Clinical Social Worker II [facility] Heart/Vascular Care Navigation  [phone]- work cell phone (preferred) [phone]- desk phone

## 2022-05-13 NOTE — Progress Notes (Signed)
Heart and Vascular Care Navigation  05/13/2022  Kevin [name] 05-09-1966 [id number]  Reason for Referral:  Patient is participating in a Managed Medicaid Plan: No, self pay only No PCP, no Insurance Engaged with patient by telephone for initial visit for Heart and Vascular Care Coordination.                                                                                                   Assessment:      LCSW received return call from pt. I introduced self, role, reason for call. 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 He drives himself to appointments. His mother remains his emergency contact. He does not have insurance and cant remember the last time he had coverage. He applied for Medicaid and Disability in the past but was not approved. Pt should be eligible for assistance through [employer] and [employer]. He also can establish with PCP through [facility] to ensure non cardiac needs are managed. Pt also encouraged to apply for SNAP benefits due to limited income. Pt agreeable to all of these options being mailed to his home address and follow up bring provided. No additional questions/concerns at this time.                                   HRT/VAS Care Coordination     Patients Home Cardiology Office [facility]   Outpatient Care Team Social Worker   Social Worker Name: Nile Riggs, [place] [facility] [phone]   Living arrangements for the past 2 months Single Family Home   Lives with: Self   Patient Current Insurance Coverage Self-Pay   Patient Has Concern With Paying Medical Bills Yes   Patient Concerns With Medical Bills no current coverage, no PCP   Medical Bill Referrals: CAFA/OC   Does Patient Have Prescription Coverage? No   Patient Prescription Assistance Programs Other   Other Assistance Programs Medications utilizes [facility]   Home Assistive Devices/Equipment None       Social  History:                                                                             SDOH Screenings   Alcohol Screen: Not on file  Depression (YTK1-6): Not on file  Financial Resource Strain: Medium Risk (05/13/2022)   Overall Financial Resource Strain (CARDIA)    Difficulty of Paying Living Expenses: Somewhat hard  Food Insecurity: No Food Insecurity (05/13/2022)   Hunger Vital Sign    Worried About Running Out of Food in the Last Year: Never true    Ran Out of Food in the Last Year: Never true  Housing: Low Risk  (05/13/2022)  Housing    Last Housing Risk Score: 0  Physical Activity: Not on file  Social Connections: Not on file  Stress: Not on file  Tobacco Use: Medium Risk (02/05/2022)   Patient History    Smoking Tobacco Use: Former    Smokeless Tobacco Use: Never    Passive Exposure: Not on file  Transportation Needs: No Transportation Needs (05/13/2022)   PRAPARE - Administrator, Civil Service (Medical): No    Lack of Transportation (Non-Medical): No    SDOH Interventions: Financial Resources:  Financial Strain Interventions: Other (Comment) (CAFA/[place] Card) Editor, commissioning for [employer] Program  Food Insecurity:  Food Insecurity Interventions: Intervention Not Indicated (mailed [employer] manager FNS card)  Housing Insecurity:  Housing Interventions: Intervention Not Indicated  Transportation:   Transportation Interventions: Intervention Not Indicated    Other Care Navigation Interventions:     Provided Pharmacy assistance resources Other   Follow-up plan:   LCSW has mailed pt the following: my card, PCP list, SNAP FNS card from [employer], [employer] and [employer]. Will f/u in a few weeks to ensure these have been received, pt encouraged to f/u with me if he has any questions/concerns that may arise.

## 2022-05-15 NOTE — Progress Notes (Signed)
Pt has been made aware of normal result and verbalized understanding.  jw

## 2022-05-29 ENCOUNTER — Telehealth: Payer: Self-pay | Admitting: Licensed Clinical Social Worker

## 2022-05-29 NOTE — Telephone Encounter (Signed)
H&V Care Navigation CSW Progress Note  Clinical Social Worker contacted patient by phone to f/u on assistance applications provided. No answer at [phone], left voicemail requesting call back should pt be interested in further assistance. Will re-attempt as able.   Patient is participating in a Managed Medicaid Plan:  No, self pay only.  SDOH Screenings   Alcohol Screen: Not on file  Depression (LYY5-0): Not on file  Financial Resource Strain: Medium Risk (05/13/2022)   Overall Financial Resource Strain (CARDIA)    Difficulty of Paying Living Expenses: Somewhat hard  Food Insecurity: No Food Insecurity (05/13/2022)   Hunger Vital Sign    Worried About Running Out of Food in the Last Year: Never true    Ran Out of Food in the Last Year: Never true  Housing: Low Risk  (05/13/2022)   Housing    Last Housing Risk Score: 0  Physical Activity: Not on file  Social Connections: Not on file  Stress: Not on file  Tobacco Use: Medium Risk (02/05/2022)   Patient History    Smoking Tobacco Use: Former    Smokeless Tobacco Use: Never    Passive Exposure: Not on file  Transportation Needs: No Transportation Needs (05/13/2022)   PRAPARE - Transportation    Lack of Transportation (Medical): No    Lack of Transportation (Non-Medical): No    Octavio Graves, MSW, LCSW Clinical Social Worker II [facility] Health Heart/Vascular Care Navigation  [phone]- work cell phone (preferred) [phone]- desk phone

## 2022-05-30 ENCOUNTER — Telehealth: Payer: Self-pay | Admitting: Licensed Clinical Social Worker

## 2022-05-30 NOTE — Telephone Encounter (Signed)
H&V Care Navigation CSW Progress Note  Clinical Social Worker contacted patient by phone to f/u on assistance applications and resources sent. Pt answered at [phone]. Re-introduced self, role, reason for call. Pt confirms he received packet, has been busy and not properly looked at it yet. I had him go and get the information while we were on the phone. We reviewed that I had sent the following: [facility] list, CAFA, [employer], and [employer] card. Pt understands he can utilize the [facility] list to find a PCP that is in the [facility] network and has some assistance at each clinic to get what he needs. We then went over assistance applications: [employer]FA and [employer]. Pt feels comfortable with completing applications and gathering documents. We discussed not mailing originals, only copies and where to send each application. We then reviewed SNAP card and how to utilize it to contact the staff at [employer] FNS team to complete application telephonically and have more assistance as pt is not receiving any additional food assistance. Pt encouraged to f/u with our team as needed to receive additional assistance.   Patient is participating in a Managed Medicaid Plan:  No, self pay only at this time.   SDOH Screenings   Alcohol Screen: Not on file  Depression (BTY6-0): Not on file  Financial Resource Strain: Medium Risk (05/13/2022)   Overall Financial Resource Strain (CARDIA)    Difficulty of Paying Living Expenses: Somewhat hard  Food Insecurity: No Food Insecurity (05/13/2022)   Hunger Vital Sign    Worried About Running Out of Food in the Last Year: Never true    Ran Out of Food in the Last Year: Never true  Housing: Low Risk  (05/13/2022)   Housing    Last Housing Risk Score: 0  Physical Activity: Not on file  Social Connections: Not on file  Stress: Not on file  Tobacco Use: Medium Risk (02/05/2022)   Patient History    Smoking Tobacco Use: Former    Smokeless  Tobacco Use: Never    Passive Exposure: Not on file  Transportation Needs: No Transportation Needs (05/13/2022)   PRAPARE - Transportation    Lack of Transportation (Medical): No    Lack of Transportation (Non-Medical): No    Octavio Graves, MSW, LCSW Clinical Social Worker II [facility] Health Heart/Vascular Care Navigation  [phone]- work cell phone (preferred) [phone]- desk phone

## 2022-06-23 ENCOUNTER — Telehealth: Payer: Self-pay | Admitting: Licensed Clinical Social Worker

## 2022-06-23 NOTE — Telephone Encounter (Signed)
H&V Care Navigation CSW Progress Note  Clinical Social Worker contacted patient by phone to f/u on assistance applications and resources sent. No answer at [phone]. Will reattempt 1x more, left voicemail requesting call back if additional assistance needed. Remain available.   Patient is participating in a Managed Medicaid Plan:  No, self pay only.   SDOH Screenings   Food Insecurity: No Food Insecurity (05/13/2022)  Housing: Low Risk  (05/13/2022)  Transportation Needs: No Transportation Needs (05/13/2022)  Financial Resource Strain: Medium Risk (05/13/2022)  Tobacco Use: Medium Risk (02/05/2022)    Westley Hummer, MSW, [place]  [phone]- work cell phone (preferred) [phone]- desk phone

## 2022-06-26 ENCOUNTER — Telehealth: Payer: Self-pay | Admitting: Licensed Clinical Social Worker

## 2022-06-26 NOTE — Telephone Encounter (Signed)
H&V Care Navigation CSW Progress Note  Clinical Social Worker contacted patient by phone again to f/u on assistance applications and resources sent. No answer again at [phone]. Left additional voicemail with name/number/offer for assistance. Will not actively follow unless pt interested in further support. Remain available.    Patient is participating in a Managed Medicaid Plan:  No, self pay only.   SDOH Screenings   Food Insecurity: No Food Insecurity (05/13/2022)  Housing: Low Risk  (05/13/2022)  Transportation Needs: No Transportation Needs (05/13/2022)  Financial Resource Strain: Medium Risk (05/13/2022)  Tobacco Use: Medium Risk (02/05/2022)    Westley Hummer, MSW, [place]  [phone]- work cell phone (preferred) [phone]- desk phone

## 2022-07-02 ENCOUNTER — Other Ambulatory Visit (HOSPITAL_COMMUNITY): Payer: Self-pay

## 2022-08-04 NOTE — Progress Notes (Unsigned)
Cardiology Office Note:    Date:  08/05/2022   ID:  Kevin [name], DOB 05/28/1966, MRN [id number]  PCP:  Patient, No Pcp Per  [facility] Providers Cardiologist:  Sherren Mocha, MD     Referring MD: No ref. provider found   Chief Complaint:  F/u for CAD    Patient Profile: Coronary artery disease  S/p NSTEMI 03/2019 >> PCI:  DES to pLAD Echo 5/23: EF 60-65, no RWMA, normal RVSF, trivial MR, RAP 3 Hyperlipidemia  Hypertension  Prediabetes   Cardiac Studies & Procedures   CARDIAC CATHETERIZATION  CARDIAC CATHETERIZATION 04/05/2019  Narrative  A drug-eluting stent was successfully placed using a STENT SYNERGY DES 3X12.  Post intervention, there is a 0% residual stenosis.  1.  Multivessel coronary artery disease with critical stenosis of the proximal/ostial LAD (culprit vessel), moderate stenosis of the ostial OM branches, and moderate stenosis of the mid RCA 2.  Normal LV systolic function with no regional wall motion abnormalities and normal LVEDP 3.  Successful PCI of the proximal LAD using a 3.0 x 12 mm Synergy DES postdilated with a 3.5 mm noncompliant balloon to high pressure  Recommendations: Aspirin and ticagrelor without interruption for a minimum of 12 months.  Aggressive medical therapy for residual CAD.  Findings Coronary Findings Diagnostic  Dominance: Right  Left Anterior Descending Ost LAD lesion is 95% stenosed. The lesion is eccentric. Mid LAD lesion is 30% stenosed. Dist LAD lesion is 50% stenosed.  Left Circumflex  First Obtuse Marginal Branch 1st Mrg lesion is 50% stenosed. High OM  Second Obtuse Marginal Branch 2nd Mrg lesion is 70% stenosed. small vessel  Right Coronary Artery Prox RCA to Mid RCA lesion is 50% stenosed. hypodense plaque  Intervention  Ost LAD lesion Stent [place] GUIDE 6FR XBLAD3.5 guide catheter was inserted. Lesion crossed with guidewire using a WIRE COUGAR XT STRL 190CM. Pre-stent angioplasty was  performed using a BALLOON SAPPHIRE 2.5X12. A drug-eluting stent was successfully placed using a STENT SYNERGY DES 3X12. Post-stent angioplasty was performed using a BALLOON SAPPHIRE [place] 3.5X10. Maximum pressure:  16 atm. Post-Intervention Lesion Assessment The intervention was successful. Pre-interventional TIMI flow is 3. Post-intervention TIMI flow is 3. No complications occurred at this lesion. There is a 0% residual stenosis post intervention.     ECHOCARDIOGRAM  ECHOCARDIOGRAM COMPLETE 02/25/2022  Narrative ECHOCARDIOGRAM REPORT    Patient Name:   Kevin [name] Date of Exam: 02/25/2022 Medical Rec #:  [id number]         Height:       73.0 in Accession #:    [id number]        Weight:       285.0 lb Date of Birth:  May 03, 1966          BSA:          2.501 m Patient Age:    56 years          BP:           124/82 mmHg Patient Gender: M                 HR:           53 bpm. Exam Location:  [place]  Procedure: 2D Echo, Cardiac Doppler and Color Doppler  Indications:    I25.10 CAD  History:        Patient has no prior history of Echocardiogram examinations. CAD and Previous Myocardial Infarction, Signs/Symptoms:Shortness of Breath; Risk Factors:Hypertension and Dyslipidemia.  Sonographer:    Coralyn Helling RDCS Referring Phys: [place]   1. Left ventricular ejection fraction, by estimation, is 60 to 65%. The left ventricle has normal function. The left ventricle has no regional wall motion abnormalities. There is mild left ventricular hypertrophy. Left ventricular diastolic parameters were normal. 2. Right ventricular systolic function is normal. The right ventricular size is normal. 3. The mitral valve is normal in structure. Trivial mitral valve regurgitation. No evidence of mitral stenosis. 4. The aortic valve is tricuspid. Aortic valve regurgitation is not visualized. Aortic valve sclerosis is present, with no evidence of aortic valve  stenosis. 5. The inferior vena cava is normal in size with greater than 50% respiratory variability, suggesting right atrial pressure of 3 mmHg.  Comparison(s): No prior Echocardiogram.  FINDINGS Left Ventricle: Left ventricular ejection fraction, by estimation, is 60 to 65%. The left ventricle has normal function. The left ventricle has no regional wall motion abnormalities. The left ventricular internal cavity size was normal in size. There is mild left ventricular hypertrophy. Left ventricular diastolic parameters were normal.  Right Ventricle: The right ventricular size is normal. Right ventricular systolic function is normal.  Left Atrium: Left atrial size was normal in size.  Right Atrium: Right atrial size was normal in size.  Pericardium: There is no evidence of pericardial effusion.  Mitral Valve: The mitral valve is normal in structure. Trivial mitral valve regurgitation. No evidence of mitral valve stenosis.  Tricuspid Valve: The tricuspid valve is normal in structure. Tricuspid valve regurgitation is not demonstrated. No evidence of tricuspid stenosis.  Aortic Valve: The aortic valve is tricuspid. Aortic valve regurgitation is not visualized. Aortic valve sclerosis is present, with no evidence of aortic valve stenosis.  Pulmonic Valve: The pulmonic valve was normal in structure. Pulmonic valve regurgitation is not visualized. No evidence of pulmonic stenosis.  Aorta: The aortic root is normal in size and structure.  Venous: The inferior vena cava is normal in size with greater than 50% respiratory variability, suggesting right atrial pressure of 3 mmHg.  IAS/Shunts: No atrial level shunt detected by color flow Doppler.   LEFT VENTRICLE PLAX 2D LVIDd:         4.60 cm   Diastology LVIDs:         2.90 cm   LV e' medial:    8.92 cm/s LV PW:         1.20 cm   LV E/e' medial:  9.6 LV IVS:        1.20 cm   LV e' lateral:   11.30 cm/s LVOT diam:     2.10 cm   LV E/e'  lateral: 7.6 LV SV:         81 LV SV Index:   32 LVOT Area:     3.46 cm   RIGHT VENTRICLE             IVC RV S prime:     11.90 cm/s  IVC diam: 1.50 cm TAPSE (M-mode): 2.1 cm  LEFT ATRIUM             Index        RIGHT ATRIUM           Index LA diam:        4.60 cm 1.84 cm/m   RA Pressure: 3.00 mmHg LA Vol (A2C):   73.4 ml 29.35 ml/m  RA Area:     16.10 cm LA Vol (A4C):   57.4 ml  22.95 ml/m  RA Volume:   42.40 ml  16.95 ml/m LA Biplane Vol: 64.8 ml 25.91 ml/m AORTIC VALVE LVOT Vmax:   103.00 cm/s LVOT Vmean:  69.800 cm/s LVOT VTI:    0.233 m  AORTA Ao Root diam: 3.50 cm Ao Asc diam:  3.10 cm  MITRAL VALVE               TRICUSPID VALVE MV Area (PHT): 3.61 cm    Estimated RAP:  3.00 mmHg MV Decel Time: 210 msec MV E velocity: 85.90 cm/s  SHUNTS MV A velocity: 72.30 cm/s  Systemic VTI:  0.23 m MV E/A ratio:  1.19        Systemic Diam: 2.10 cm  Kirk Ruths MD Electronically signed by Kirk Ruths MD Signature Date/Time: 02/25/2022/2:46:56 PM    Final              History of Present Illness:   Kevin [name] is a 56 y.o. male with the above problem list.  He was last seen in May 2023.  He returns for follow-up.  He is here alone.  Since last seen, he has not had chest pain, shortness of breath, syncope, orthopnea, leg pain.  He does eat out a lot.  He sometimes forgets his second dose of metoprolol tartrate.  He was previously on losartan.  It is not clear why this was stopped.  His blood pressure was previously well controlled while he was on losartan and metoprolol tartrate.        Past Medical History:  Diagnosis Date   Coronary artery disease    a. 04/05/19 NSTEMI, DES to pLAD,    Hyperlipidemia LDL goal <70    Hypertension    NSTEMI (non-ST elevated myocardial infarction) ([place]) 04/05/2019   Pre-diabetes    Current Medications: Current Meds  Medication Sig   aspirin 81 MG chewable tablet Chew 1 tablet (81 mg total) by mouth daily.   losartan  (COZAAR) 25 MG tablet Take 1 tablet (25 mg total) by mouth daily.   metoprolol succinate (TOPROL XL) 25 MG 24 hr tablet Take 1 tablet (25 mg total) by mouth daily.   rosuvastatin (CRESTOR) 40 MG tablet Take 1 tablet (40 mg total) by mouth daily.   [DISCONTINUED] metoprolol tartrate (LOPRESSOR) 25 MG tablet Take 0.5 tablets (12.5 mg total) by mouth 2 (two) times daily.    Allergies:   Patient has no known allergies.   Social History   Tobacco Use   Smoking status: Former    Types: Cigarettes    Quit date: 10/06/2006    Years since quitting: 15.8   Smokeless tobacco: Never  Vaping Use   Vaping Use: Never used  Substance Use Topics   Alcohol use: Not Currently    Comment:     Drug use: No    Family Hx: The patient's family history includes Bladder Cancer in his father; CAD in his maternal grandfather and paternal grandmother; Diabetes in his father, maternal grandmother, and paternal grandmother; Hyperlipidemia in his mother; Hypertension in his father, maternal grandfather, and maternal grandmother; Kidney disease in his maternal grandmother.  ROS   EKGs/Labs/Other Test Reviewed:    EKG:  EKG is not ordered today.    Recent Labs: 02/05/2022: BUN 10; Creatinine, Ser 1.06; Hemoglobin 17.0; Platelets 244; Potassium 4.7; Sodium 138; TSH 2.320 05/09/2022: ALT 36   Recent Lipid Panel Recent Labs    05/09/22 0856  CHOL 116  TRIG 135  HDL 29*  LDLCALC 63  Risk Assessment/Calculations/Metrics:         HYPERTENSION CONTROL Vitals:   08/05/22 1333 08/05/22 1356  BP: (!) 148/82 (!) 140/100    The patient's blood pressure is elevated above target today.  In order to address the patient's elevated BP: A new medication was prescribed today.       Physical Exam:    VS:  BP (!) 140/100   Pulse 60   Ht _0  (1.854 m)   Wt 286 lb 12.8 oz (130.1 kg)   SpO2 98%   BMI 37.84 kg/m     Wt Readings from Last 3 Encounters:  08/05/22 286 lb 12.8 oz (130.1 kg)  02/05/22 285 lb  (129.3 kg)  06/22/20 285 lb (129.3 kg)    Constitutional:      Appearance: Healthy appearance. Not in distress.  Neck:     Vascular: JVD normal.  Pulmonary:     Effort: Pulmonary effort is normal.     Breath sounds: No wheezing. No rales.  Cardiovascular:     Normal rate. Regular rhythm. Normal S1. Normal S2.      Murmurs: There is no murmur.  Edema:    Peripheral edema absent.  Abdominal:     Palpations: Abdomen is soft.  Skin:    General: Skin is warm and dry.  Neurological:     General: No focal deficit present.     Mental Status: Alert and oriented to person, place and time.         ASSESSMENT & PLAN:   Essential hypertension BP is too high. He used to be on Losartan. It is not clear why this was stopped. His BP was much better controlled when he was taking it. He also forgets the 2nd dose of metoprolol sometimes.  DC Metoprolol tartrate Start Metoprolol succinate 25 mg once daily Start Losartan 25 mg once daily BMET 1 month with RN visit to check BP F/u in 6 mos.  Coronary artery disease involving native coronary artery of native heart without angina pectoris History of non-STEMI in June 2022 with a DES to the LAD.  Echocardiogram in May 2023 demonstrated normal LV function.  He is doing well without anginal symptoms.  Continue aspirin 81 mg daily, rosuvastatin 40 mg daily.  Change metoprolol to tartrate to metoprolol succinate 25 mg daily.  Follow-up 6 months.  Hyperlipidemia Recent LDL optimal.  Continue rosuvastatin 40 mg daily.            Dispo:  Return in about 6 months (around 02/03/2023) for Routine Follow Up, w/ Richardson Dopp, PA-C.   Medication Adjustments/Labs and Tests Ordered: Current medicines are reviewed at length with the patient today.  Concerns regarding medicines are outlined above.  Tests Ordered: Orders Placed This Encounter  Procedures   Basic metabolic panel   Medication Changes: Meds ordered this encounter  Medications   metoprolol  succinate (TOPROL XL) 25 MG 24 hr tablet    Sig: Take 1 tablet (25 mg total) by mouth daily.    Dispense:  90 tablet    Refill:  3   losartan (COZAAR) 25 MG tablet    Sig: Take 1 tablet (25 mg total) by mouth daily.    Dispense:  90 tablet    Refill:  3   Signed, Richardson Dopp, PA-C  08/05/2022 2:04 PM    [facility] [place], [place], [place]  [address] Phone: [phone]; Fax: [phone]

## 2022-08-05 ENCOUNTER — Encounter: Payer: Self-pay | Admitting: Physician Assistant

## 2022-08-05 ENCOUNTER — Other Ambulatory Visit (HOSPITAL_COMMUNITY): Payer: Self-pay

## 2022-08-05 ENCOUNTER — Ambulatory Visit: Payer: Self-pay | Attending: Physician Assistant | Admitting: Physician Assistant

## 2022-08-05 VITALS — BP 140/100 | HR 60 | Ht 73.0 in | Wt 286.8 lb

## 2022-08-05 DIAGNOSIS — I2510 Atherosclerotic heart disease of native coronary artery without angina pectoris: Secondary | ICD-10-CM

## 2022-08-05 DIAGNOSIS — E782 Mixed hyperlipidemia: Secondary | ICD-10-CM

## 2022-08-05 DIAGNOSIS — I10 Essential (primary) hypertension: Secondary | ICD-10-CM

## 2022-08-05 MED ORDER — METOPROLOL SUCCINATE ER 25 MG PO TB24
25.0000 mg | ORAL_TABLET | Freq: Every day | ORAL | 3 refills | Status: DC
  Filled 2022-08-05: qty 90, 90d supply, fill #0

## 2022-08-05 MED ORDER — LOSARTAN POTASSIUM 25 MG PO TABS
25.0000 mg | ORAL_TABLET | Freq: Every day | ORAL | 3 refills | Status: DC
  Filled 2022-08-05: qty 90, 90d supply, fill #0

## 2022-08-05 NOTE — Patient Instructions (Signed)
Medication Instructions:  Your physician has recommended you make the following change in your medication:   STOP Lopressor  START Toprol XL 25 taking 1 daily  START Losartan 25 mg taking 1 daily   *If you need a refill on your cardiac medications before your next appointment, please call your pharmacy*   Lab Work: 1 MONTH:  BMET  If you have labs (blood work) drawn today and your tests are completely normal, you will receive your results only by: [place] (if you have MyChart) OR A paper copy in the mail If you have any lab test that is abnormal or we need to change your treatment, we will call you to review the results.   Testing/Procedures: None ordered   Follow-Up: At [facility], you and your health needs are our priority.  As part of our continuing mission to provide you with exceptional heart care, we have created designated Provider Care Teams.  These Care Teams include your primary Cardiologist (physician) and Advanced Practice Providers (APPs -  Physician Assistants and Nurse Practitioners) who all work together to provide you with the care you need, when you need it.  We recommend signing up for the patient portal called "MyChart".  Sign up information is provided on this After Visit Summary.  MyChart is used to connect with patients for Virtual Visits (Telemedicine).  Patients are able to view lab/test results, encounter notes, upcoming appointments, etc.  Non-urgent messages can be sent to your provider as well.   To learn more about what you can do with MyChart, go to [email or web address].    Your next appointment:   6 month(s)  The format for your next appointment:   In Person  Provider:   Richardson Dopp, PA-C         Other Instructions   Important Information About Sugar

## 2022-08-05 NOTE — Assessment & Plan Note (Signed)
BP is too high. He used to be on Losartan. It is not clear why this was stopped. His BP was much better controlled when he was taking it. He also forgets the 2nd dose of metoprolol sometimes.  DC Metoprolol tartrate Start Metoprolol succinate 25 mg once daily Start Losartan 25 mg once daily BMET 1 month with RN visit to check BP F/u in 6 mos.

## 2022-08-05 NOTE — Assessment & Plan Note (Signed)
History of non-STEMI in June 2022 with a DES to the LAD.  Echocardiogram in May 2023 demonstrated normal LV function.  He is doing well without anginal symptoms.  Continue aspirin 81 mg daily, rosuvastatin 40 mg daily.  Change metoprolol to tartrate to metoprolol succinate 25 mg daily.  Follow-up 6 months.

## 2022-08-05 NOTE — Assessment & Plan Note (Signed)
Recent LDL optimal.  Continue rosuvastatin 40 mg daily.

## 2022-08-08 ENCOUNTER — Ambulatory Visit: Payer: Self-pay | Admitting: Physician Assistant

## 2022-08-13 ENCOUNTER — Other Ambulatory Visit (HOSPITAL_COMMUNITY): Payer: Self-pay

## 2022-09-04 ENCOUNTER — Ambulatory Visit: Payer: Self-pay

## 2022-09-04 ENCOUNTER — Ambulatory Visit: Payer: Self-pay | Attending: Internal Medicine | Admitting: *Deleted

## 2022-09-04 VITALS — BP 138/88 | HR 60 | Ht 73.0 in | Wt 281.0 lb

## 2022-09-04 DIAGNOSIS — I10 Essential (primary) hypertension: Secondary | ICD-10-CM

## 2022-09-04 DIAGNOSIS — I2510 Atherosclerotic heart disease of native coronary artery without angina pectoris: Secondary | ICD-10-CM

## 2022-09-04 NOTE — Progress Notes (Signed)
   Nurse Visit   Date of Encounter: 09/04/2022 ID: [name], DOB [birth year], MRN [id number]  PCP:  Patient, No Pcp Per   [facility] Providers Cardiologist:  Tonny Bollman, MD      Visit Details   VS:  BP 138/88   Pulse 60   Ht 6\' 1"  (1.854 m)   Wt 281 lb (127.5 kg)   SpO2 99%   BMI 37.07 kg/m  , BMI Body mass index is 37.07 kg/m.  Wt Readings from Last 3 Encounters:  09/04/22 281 lb (127.5 kg)  08/05/22 286 lb 12.8 oz (130.1 kg)  02/05/22 285 lb (129.3 kg)     Reason for visit: BP check and labs Performed today: Vitals, EKG, Provider consulted:Dr 04/07/22, and Education Changes (medications, testing, etc.) : none Length of Visit: 25 minutes    Medications Adjustments/Labs and Tests Ordered: No orders of the defined types were placed in this encounter.  No orders of the defined types were placed in this encounter.  Patient here for BP check. Also had lab work done.  BP as noted.  Briefly reviewed with Dr Ladona Ridgel and any changes to be made by Ladona Ridgel, PA if needed.  Patient reports he is feeling fine today but was very short of breath when cleaning gutters yesterday.  This was the most work he had done recently. Patient aware office will contact him with lab results and with any changes needed.   Signed, Tereso Newcomer, RN  09/04/2022 2:49 PM

## 2022-09-05 LAB — BASIC METABOLIC PANEL
BUN/Creatinine Ratio: 10 (ref 9–20)
BUN: 11 mg/dL (ref 6–24)
CO2: 20 mmol/L (ref 20–29)
Calcium: 9.6 mg/dL (ref 8.7–10.2)
Chloride: 102 mmol/L (ref 96–106)
Creatinine, Ser: 1.06 mg/dL (ref 0.76–1.27)
Glucose: 124 mg/dL — ABNORMAL HIGH (ref 70–99)
Potassium: 5.2 mmol/L (ref 3.5–5.2)
Sodium: 138 mmol/L (ref 134–144)
eGFR: 83 mL/min/{1.73_m2} (ref >59)

## 2022-09-05 NOTE — Progress Notes (Signed)
BP borderline.  Will increase Losartan to 50 mg once daily. Tereso Newcomer, PA-C    09/05/2022 8:36 AM

## 2022-09-08 ENCOUNTER — Other Ambulatory Visit: Payer: Self-pay | Admitting: *Deleted

## 2022-09-08 ENCOUNTER — Other Ambulatory Visit (HOSPITAL_COMMUNITY): Payer: Self-pay

## 2022-09-08 DIAGNOSIS — I10 Essential (primary) hypertension: Secondary | ICD-10-CM

## 2022-09-08 MED ORDER — LOSARTAN POTASSIUM 50 MG PO TABS
50.0000 mg | ORAL_TABLET | Freq: Every day | ORAL | 3 refills | Status: DC
  Filled 2022-09-08: qty 90, 90d supply, fill #0

## 2022-09-09 ENCOUNTER — Other Ambulatory Visit (HOSPITAL_COMMUNITY): Payer: Self-pay

## 2022-09-17 ENCOUNTER — Other Ambulatory Visit (HOSPITAL_COMMUNITY): Payer: Self-pay

## 2022-10-09 ENCOUNTER — Ambulatory Visit: Payer: Self-pay | Attending: Physician Assistant

## 2022-10-09 DIAGNOSIS — I10 Essential (primary) hypertension: Secondary | ICD-10-CM

## 2022-10-09 LAB — BASIC METABOLIC PANEL
BUN/Creatinine Ratio: 10 (ref 9–20)
BUN: 11 mg/dL (ref 6–24)
CO2: 23 mmol/L (ref 20–29)
Calcium: 9.8 mg/dL (ref 8.7–10.2)
Chloride: 100 mmol/L (ref 96–106)
Creatinine, Ser: 1.06 mg/dL (ref 0.76–1.27)
Glucose: 205 mg/dL — ABNORMAL HIGH (ref 70–99)
Potassium: 4.6 mmol/L (ref 3.5–5.2)
Sodium: 138 mmol/L (ref 134–144)
eGFR: 83 mL/min/{1.73_m2} (ref >59)

## 2022-10-23 ENCOUNTER — Emergency Department (HOSPITAL_BASED_OUTPATIENT_CLINIC_OR_DEPARTMENT_OTHER): Payer: 59

## 2022-10-23 ENCOUNTER — Other Ambulatory Visit: Payer: Self-pay

## 2022-10-23 ENCOUNTER — Encounter (HOSPITAL_BASED_OUTPATIENT_CLINIC_OR_DEPARTMENT_OTHER): Payer: Self-pay | Admitting: Emergency Medicine

## 2022-10-23 ENCOUNTER — Emergency Department (HOSPITAL_BASED_OUTPATIENT_CLINIC_OR_DEPARTMENT_OTHER)
Admission: EM | Admit: 2022-10-23 | Discharge: 2022-10-23 | Disposition: A | Discharge status: Home / Self Care | Payer: 59 | Attending: Emergency Medicine | Admitting: Emergency Medicine

## 2022-10-23 ENCOUNTER — Other Ambulatory Visit (HOSPITAL_COMMUNITY): Payer: Self-pay

## 2022-10-23 DIAGNOSIS — Z79899 Other long term (current) drug therapy: Secondary | ICD-10-CM | POA: Insufficient documentation

## 2022-10-23 DIAGNOSIS — Z7982 Long term (current) use of aspirin: Secondary | ICD-10-CM | POA: Diagnosis not present

## 2022-10-23 DIAGNOSIS — I2510 Atherosclerotic heart disease of native coronary artery without angina pectoris: Secondary | ICD-10-CM | POA: Diagnosis not present

## 2022-10-23 DIAGNOSIS — R059 Cough, unspecified: Secondary | ICD-10-CM | POA: Diagnosis not present

## 2022-10-23 DIAGNOSIS — U071 COVID-19: Secondary | ICD-10-CM | POA: Diagnosis not present

## 2022-10-23 DIAGNOSIS — R052 Subacute cough: Secondary | ICD-10-CM

## 2022-10-23 LAB — RESP PANEL BY RT-PCR (RSV, FLU A&B, COVID)  RVPGX2
Influenza A by PCR: NEGATIVE
Influenza B by PCR: NEGATIVE
Resp Syncytial Virus by PCR: NEGATIVE
SARS Coronavirus 2 by RT PCR: POSITIVE — AB

## 2022-10-23 MED ORDER — BENZONATATE 100 MG PO CAPS
100.0000 mg | ORAL_CAPSULE | Freq: Three times a day (TID) | ORAL | 0 refills | Status: DC
  Filled 2022-10-23: qty 21, 7d supply, fill #0

## 2022-10-23 NOTE — ED Provider Notes (Signed)
[facility] EMERGENCY DEPARTMENT Provider Note   CSN: [id number] Arrival date & time: 10/23/22  1049     History  Chief Complaint  Patient presents with   Cough    Kevin [name] is a 57 y.o. male.  Patient is a 57 year old male with a past medical history of CAD status post stent placement presenting to the emergency department for cough.  The patient states that he has had a nonproductive cough for the past several weeks.  He denies any associated fevers or chills, nausea, vomiting or diarrhea.  He denies any chest pain or shortness of breath.  States that his friend was concerned with the amount that he was coughing and recommended that he come to the emergency department.  The history is provided by the patient.  Cough      Home Medications Prior to Admission medications   Medication Sig Start Date End Date Taking? Authorizing Provider  benzonatate (TESSALON) 100 MG capsule Take 1 capsule (100 mg total) by mouth every 8 (eight) hours. 10/23/22  Yes Elayne Snare K, DO  aspirin 81 MG chewable tablet Chew 1 tablet (81 mg total) by mouth daily. 04/07/19   Arty Baumgartner, NP  losartan (COZAAR) 50 MG tablet Take 1 tablet (50 mg total) by mouth daily. 09/08/22   Tereso Newcomer T, PA-C  metoprolol succinate (TOPROL XL) 25 MG 24 hr tablet Take 1 tablet (25 mg total) by mouth daily. 08/05/22   Tereso Newcomer T, PA-C  rosuvastatin (CRESTOR) 40 MG tablet Take 1 tablet (40 mg total) by mouth daily. 02/06/22   Tereso Newcomer T, PA-C      Allergies    Patient has no known allergies.    Review of Systems   Review of Systems  Respiratory:  Positive for cough.     Physical Exam Updated Vital Signs BP (!) 159/110 (BP Location: Left Arm)   Pulse 65   Temp 98.2 F (36.8 C) (Oral)   Resp 19   Ht 6\' 1"  (1.854 m)   Wt 122.7 kg   SpO2 98%   BMI 35.69 kg/m  Physical Exam Vitals and nursing note reviewed.  Constitutional:      General: He is not in acute distress.     Appearance: Normal appearance.  HENT:     Head: Normocephalic and atraumatic.     Nose: Nose normal.     Mouth/Throat:     Mouth: Mucous membranes are moist.     Pharynx: Oropharynx is clear.  Eyes:     Extraocular Movements: Extraocular movements intact.     Conjunctiva/sclera: Conjunctivae normal.  Cardiovascular:     Rate and Rhythm: Normal rate and regular rhythm.     Heart sounds: Normal heart sounds.  Pulmonary:     Effort: Pulmonary effort is normal.     Breath sounds: Normal breath sounds.  Abdominal:     General: Abdomen is flat.     Palpations: Abdomen is soft.     Tenderness: There is no abdominal tenderness.  Musculoskeletal:        General: Normal range of motion.     Cervical back: Normal range of motion and neck supple.     Right lower leg: No edema.     Left lower leg: No edema.  Skin:    General: Skin is warm and dry.  Neurological:     General: No focal deficit present.     Mental Status: He is alert and oriented to person, place,  and time.  Psychiatric:        Mood and Affect: Mood normal.        Behavior: Behavior normal.     ED Results / Procedures / Treatments   Labs (all labs ordered are listed, but only abnormal results are displayed) Labs Reviewed  RESP PANEL BY RT-PCR (RSV, FLU A&B, COVID)  RVPGX2 - Abnormal; Notable for the following components:      Result Value   SARS Coronavirus 2 by RT PCR POSITIVE (*)    All other components within normal limits    EKG None  Radiology DG Chest 2 View  Result Date: 10/23/2022 CLINICAL DATA:  cough EXAM: CHEST - 2 VIEW COMPARISON:  November 04, 2018 FINDINGS: The cardiomediastinal silhouette is unchanged in contour. No pleural effusion. No pneumothorax. No acute pleuroparenchymal abnormality. Visualized abdomen is unremarkable. Multilevel degenerative changes of the thoracic spine. IMPRESSION: No acute cardiopulmonary abnormality. Electronically Signed   By: Valentino Saxon M.D.   On: 10/23/2022  11:30    Procedures Procedures    Medications Ordered in ED Medications - No data to display  ED Course/ Medical Decision Making/ A&P                             Medical Decision Making This patient presents to the ED with chief complaint(s) of cough with pertinent past medical history of CAD which further complicates the presenting complaint. The complaint involves an extensive differential diagnosis and also carries with it a high risk of complications and morbidity.    The differential diagnosis includes viral syndrome, patient has no focal lung sounds and is satting well on room air making pneumonia unlikely, patient's uvula is midline is no trismus is tolerating secretions and has normal phonation making RPA, PTA or Ludwig's unlikely, patient has no signs of severe dehydration on exam, no wheezing on exam making bronchitis unlikely   Additional history obtained: Additional history obtained from N/A Records reviewed outpatient cardiology records  ED Course and Reassessment: Patient was initially evaluated in triage and had chest x-ray and viral swab performed.  He tested positive for COVID-19 however since he has had greater than 5 days of symptoms was outside the window for antiviral treatment.  Chest x-ray showed no acute disease.  They recommended Tylenol and Motrin further fevers and bodyaches and given Tessalon Perles for cough.  I recommended primary care follow-up and were given strict return precautions.  Independent labs interpretation:  The following labs were independently interpreted: COVID positive  Independent visualization of imaging: - I independently visualized the following imaging with scope of interpretation limited to determining acute life threatening conditions related to emergency care: CXR, which revealed no acute disease  Consultation: - Consulted or discussed management/test interpretation w/ external professional: N/A  Consideration for admission or  further workup: Patient has no emergent conditions requiring admission or further work-up at this time and is stable for discharge home with primary care follow-up  Social Determinants of health: N/A   Amount and/or Complexity of Data Reviewed Radiology: ordered.          Final Clinical Impression(s) / ED Diagnoses Final diagnoses:  COVID-19  Subacute cough    Rx / DC Orders ED Discharge Orders          Ordered    benzonatate (TESSALON) 100 MG capsule  Every 8 hours        10/23/22 1224  Leanord Asal K, DO 10/23/22 1226

## 2022-10-23 NOTE — ED Triage Notes (Addendum)
Non productive cough for several weeks.  No fever, no congestion, no runny nose.  Denies chest pain or SOB.  Pt states his roommate made him come to get the cough checked out.

## 2022-10-23 NOTE — ED Notes (Signed)
Discharge instructions reviewed with patient. Patient verbalizes understanding, no further questions at this time. Medications/prescriptions and follow up information provided. No acute distress noted at time of departure.  

## 2022-10-23 NOTE — Discharge Instructions (Signed)
You were seen in the emergency department for your cough.  You tested positive for COVID which is likely causing your cough.  Since you have had more than 5 days of symptoms, the antiviral treatment for COVID is unlikely to improve or shorten your course.  You can take Tessalon as needed for your cough as well as use raw honey in hot water or tea.  You can try Mucinex or other decongestions to help with congestion.  You should follow-up with your primary doctor in the next few days to have your symptoms rechecked.  You should return to the emergency department for recurrent fevers, severe chest pain or worsening shortness of breath or any other new or concerning symptoms.

## 2022-11-12 ENCOUNTER — Telehealth: Payer: Self-pay | Admitting: *Deleted

## 2022-11-12 ENCOUNTER — Encounter: Payer: Self-pay | Admitting: Physician Assistant

## 2022-11-12 ENCOUNTER — Ambulatory Visit: Payer: MEDICAID | Attending: Physician Assistant | Admitting: Physician Assistant

## 2022-11-12 ENCOUNTER — Other Ambulatory Visit (HOSPITAL_COMMUNITY): Payer: Self-pay

## 2022-11-12 ENCOUNTER — Ambulatory Visit
Admission: RE | Admit: 2022-11-12 | Discharge: 2022-11-12 | Disposition: A | Discharge status: Home / Self Care | Payer: Self-pay | Source: Ambulatory Visit | Attending: Physician Assistant | Admitting: Physician Assistant

## 2022-11-12 VITALS — BP 164/102 | HR 68 | Ht 73.0 in | Wt 282.8 lb

## 2022-11-12 DIAGNOSIS — R400 Somnolence: Secondary | ICD-10-CM | POA: Diagnosis not present

## 2022-11-12 DIAGNOSIS — E782 Mixed hyperlipidemia: Secondary | ICD-10-CM | POA: Diagnosis not present

## 2022-11-12 DIAGNOSIS — I10 Essential (primary) hypertension: Secondary | ICD-10-CM | POA: Diagnosis not present

## 2022-11-12 DIAGNOSIS — R0602 Shortness of breath: Secondary | ICD-10-CM

## 2022-11-12 DIAGNOSIS — R0683 Snoring: Secondary | ICD-10-CM

## 2022-11-12 DIAGNOSIS — I2510 Atherosclerotic heart disease of native coronary artery without angina pectoris: Secondary | ICD-10-CM

## 2022-11-12 MED ORDER — LOSARTAN POTASSIUM 100 MG PO TABS
100.0000 mg | ORAL_TABLET | Freq: Every day | ORAL | 3 refills | Status: DC
  Filled 2022-11-12: qty 90, 90d supply, fill #0

## 2022-11-12 NOTE — Assessment & Plan Note (Signed)
LDL optimal on most recent lab work.  Continue current Rx with Crestor 40 mg daily.

## 2022-11-12 NOTE — Progress Notes (Signed)
Cardiology Office Note:    Date:  11/12/2022   ID:  Kevin [name], DOB 1966/02/06, MRN [id number]  PCP:  Patient, No Pcp Per  [place] Prov[facility] Cardiologist:  Sherren Mocha, MD     Referring MD: No ref. provider found   Patient Profile: Coronary artery disease  S/p NSTEMI 03/2019 >> PCI: 3 x 12 mm DES to pLAD LHC 04/05/2019: LAD ostial 95, mid 30, distal 50; OM1 50, OM2 70 (small); RCA proximal 50 TTE 02/25/22: EF 60-65, no RWMA, normal RVSF, trivial MR, RAP 3 Hyperlipidemia  Hypertension  Prediabetes      History of Present Illness:   Kevin [name] is a 57 y.o. male with the above problem list.  He was last seen 08/05/22. He was seen in the ED 10/23/22 with COVID-19. He had symptoms > 5 days and did not qualify for antivirals. He returns for the evaluation of shortness of breath.  He is here alone.  He has noted symptoms of shortness of breath with exertion for quite some time.  He feels that he did recover from COVID-19 and had minimal symptoms.  Over the past couple of weeks, his shortness of breath has gotten worse.  He notes shortness of breath with minimal to moderate exertion.  He does not have shortness of breath at rest.  He does not have orthopnea, PND or lower extremity edema.  He has not noticed any significant weight gain.  He has not had chest pain.  He is not having symptoms reminiscent of his previous angina.  He has not had syncope.  He does note significant fatigue.  He has been told that he snores.  EKG: NSR, HR 66, leftward axis, no ST-T wave changes, QTc 421, no change from prior tracing     Reviewed and updated this encounter:  Tobacco  Allergies  Meds  Problems  Med Hx  Surg Hx  Fam Hx     Review of Systems  Constitutional: Positive for malaise/fatigue. Negative for fever.  Respiratory:  Negative for cough and wheezing.   Gastrointestinal:  Negative for hematochezia and melena.  Genitourinary:  Negative for hematuria.    Labs/Other Test  Reviewed:   Recent Labs: 02/05/2022: Hemoglobin 17.0; Platelets 244; TSH 2.320 05/09/2022: ALT 36 10/09/2022: BUN 11; Creatinine, Ser 1.06; Potassium 4.6; Sodium 138   Recent Lipid Panel Recent Labs    05/09/22 0856  CHOL 116  TRIG 135  HDL 29*  LDLCALC 63     Risk Assessment/Calculations/Metrics:     STOP-Bang Score:  7      HYPERTENSION CONTROL Vitals:   11/12/22 1404 11/12/22 1432  BP: (!) 162/90 (!) 164/102    The patient's blood pressure is elevated above target today.  In order to address the patient's elevated BP: A current anti-hypertensive medication was adjusted today.      Physical Exam:   VS:  BP (!) 164/102   Pulse 68   Ht 6\' 1"  (1.854 m)   Wt 282 lb 12.8 oz (128.3 kg)   SpO2 98%   BMI 37.31 kg/m    Wt Readings from Last 3 Encounters:  11/12/22 282 lb 12.8 oz (128.3 kg)  10/23/22 270 lb 8.4 oz (122.7 kg)  09/04/22 281 lb (127.5 kg)    Constitutional:      Appearance: Healthy appearance. Not in distress.  Neck:     Vascular: No JVR. JVD normal.  Pulmonary:     Breath sounds: Normal breath sounds. No wheezing. No  rales.  Cardiovascular:     Normal rate. Regular rhythm. Normal S1. Normal S2.      Murmurs: There is no murmur.  Edema:    Peripheral edema absent.  Abdominal:     Palpations: Abdomen is soft. There is no hepatomegaly.  Skin:    General: Skin is warm and dry.         ASSESSMENT & PLAN:   SOB (shortness of breath) He notes a long hx of shortness of breath that has gotten worse in the last couple of weeks. I had him do an echocardiogram in May 2023 b/c of some shortness of breath he noted after eating. He also notes fatigue but no chest pain. He has no signs of CHF on exam. His BP is uncontrolled. He is certainly at risk for (HFpEF) heart failure with preserved ejection fraction or reduced EF with uncontrolled BP. He snores and likely has sleep apnea which may be contributing to some of his symptoms. He has not had chest pain but with his hx  of coronary artery disease, ischemia should be ruled out. He just had COVID. His lung exam is normal. However, given his recent illness, I will also get a CXR to rule out infection, edema.  CMET, CBC, TSH, BNP Echocardiogram  Exercise Myoview CXR Home sleep study F/u 4 weeks  Coronary artery disease involving native coronary artery of native heart without angina pectoris History of non-STEMI in June 2022 with a DES to the LAD.  He did have moderate residual disease in the distal LAD and moderate disease in the OM1 and RCA.  He had 70% stenosis in a small OM 2 treated medically.  He is not having chest discomfort to suggest angina.  However, given his shortness of breath with exertion, I have suggested we proceed with stress testing.  Fortunately, his EKG today does not demonstrate any significant ST-T wave changes. Exercise Myoview This can be transitioned to [place] if he cannot achieve target heart rate Continue aspirin 81 mg daily, Toprol-XL 25 mg daily, Crestor 40 mg daily  Essential hypertension Blood pressure is markedly uncontrolled.  Increase losartan to 100 mg daily.  Obtain CMET today.  I will also get a BMET when he returns for follow-up.  Continue Toprol-XL 25 mg daily.  If blood pressure uncontrolled at follow-up, consider adding thiazide diuretic versus amlodipine.  Hyperlipidemia LDL optimal on most recent lab work.  Continue current Rx with Crestor 40 mg daily.    Snoring STOP-Bang Score:  7     Arrange home sleep study.  Daytime sleepiness He likely has OSA. Obtain sleep study as noted. Obtain TSH, CBC as well.          Shared Decision Making/Informed Consent The risks [chest pain, shortness of breath, cardiac arrhythmias, dizziness, blood pressure fluctuations, myocardial infarction, stroke/transient ischemic attack, nausea, vomiting, allergic reaction, radiation exposure, metallic taste sensation and life-threatening complications (estimated to be 1 in 10,000)],  benefits (risk stratification, diagnosing coronary artery disease, treatment guidance) and alternatives of a nuclear stress test were discussed in detail with Kevin [name] and he agrees to proceed.   Dispo:  Return in about 4 weeks (around 12/10/2022) for Follow up after testing, w/ Richardson Dopp, PA-C.   Signed, Richardson Dopp, PA-C  11/12/2022 3:08 PM    [address], [place], [place]  [address] Phone: [phone]; Fax: [phone]

## 2022-11-12 NOTE — Assessment & Plan Note (Signed)
He likely has OSA. Obtain sleep study as noted. Obtain TSH, CBC as well.

## 2022-11-12 NOTE — Patient Instructions (Addendum)
Medication Instructions:  Your physician has recommended you make the following change in your medication:   INCREASE the Losartan to 100 mg taking 1 daily.   You may use the 50 mg tablets up by taking 2 at a time, daily.  *If you need a refill on your cardiac medications before your next appointment, please call your pharmacy*   Lab Work: TODAY:  CMET, PRO BNP, CBC, & TSH  If you have labs (blood work) drawn today and your tests are completely normal, you will receive your results only by: [place] (if you have MyChart) OR A paper copy in the mail If you have any lab test that is abnormal or we need to change your treatment, we will call you to review the results.   Testing/Procedures: A chest x-ray takes a picture of the organs and structures inside the chest, including the heart, lungs, and blood vessels. This test can show several things, including, whether the heart is enlarges; whether fluid is building up in the lungs; and whether pacemaker / defibrillator leads are still in place.  [facility] [address], [address] [place] [address] [phone]   Your physician has requested that you have an echocardiogram. Echocardiography is a painless test that uses sound waves to create images of your heart. It provides your doctor with information about the size and shape of your heart and how well your heart's chambers and valves are working. This procedure takes approximately one hour. There are no restrictions for this procedure. Please do NOT wear cologne, perfume, aftershave, or lotions (deodorant is allowed). Please arrive 15 minutes prior to your appointment time.   Your physician has requested that you have a lexiscan myoview. For further information please visit [email or web address]. Please follow instruction sheet, BELOW:    You are scheduled for a Myocardial Perfusion Imaging Study Please arrive 15 minutes prior to your  appointment time for registration and insurance purposes.  The test will take approximately 3 to 4 hours to complete; you may bring reading material.  If someone comes with you to your appointment, they will need to remain in the main lobby due to limited space in the testing area. **If you are pregnant or breastfeeding, please notify the nuclear lab prior to your appointment**  How to prepare for your Myocardial Perfusion Test: Do not eat or drink 3 hours prior to your test, except you may have water. Do not consume products containing caffeine (regular or decaffeinated) 12 hours prior to your test. (ex: coffee, chocolate, sodas, tea). Do bring a list of your current medications with you.  If not listed below, you may take your medications as normal. Do wear comfortable clothes (no dresses or overalls) and walking shoes, tennis shoes preferred (No heels or open toe shoes are allowed). Do NOT wear cologne, perfume, aftershave, or lotions (deodorant is allowed). If these instructions are not followed, your test will have to be rescheduled.   Your physician has recommended that you have a Itamar Sleep study. This test records several body functions during sleep, including: brain activity, eye movement, oxygen and carbon dioxide blood levels, heart rate and rhythm, breathing rate and rhythm, the flow of air through your mouth and nose, snoring, body muscle movements, and chest and belly movement.  PLEASE DO NOT OPEN THE BOX UNTIL WE CALL YOU WITH YOUR AUTHORIZATION CODE.     Follow-Up: At [facility], you and your health needs are our  priority.  As part of our continuing mission to provide you with exceptional heart care, we have created designated Provider Care Teams.  These Care Teams include your primary Cardiologist (physician) and Advanced Practice Providers (APPs -  Physician Assistants and Nurse Practitioners) who all work together to provide you with the care you need, when you need  it.  We recommend signing up for the patient portal called "MyChart".  Sign up information is provided on this After Visit Summary.  MyChart is used to connect with patients for Virtual Visits (Telemedicine).  Patients are able to view lab/test results, encounter notes, upcoming appointments, etc.  Non-urgent messages can be sent to your provider as well.   To learn more about what you can do with MyChart, go to [email or web address].    Your next appointment:   After Testing is complete  Provider:   Richardson Dopp, PA-C         Other Instructions

## 2022-11-12 NOTE — Assessment & Plan Note (Signed)
History of non-STEMI in June 2022 with a DES to the LAD.  He did have moderate residual disease in the distal LAD and moderate disease in the OM1 and RCA.  He had 70% stenosis in a small OM 2 treated medically.  He is not having chest discomfort to suggest angina.  However, given his shortness of breath with exertion, I have suggested we proceed with stress testing.  Fortunately, his EKG today does not demonstrate any significant ST-T wave changes. Exercise Myoview This can be transitioned to [place] if he cannot achieve target heart rate Continue aspirin 81 mg daily, Toprol-XL 25 mg daily, Crestor 40 mg daily

## 2022-11-12 NOTE — Assessment & Plan Note (Signed)
Blood pressure is markedly uncontrolled.  Increase losartan to 100 mg daily.  Obtain CMET today.  I will also get a BMET when he returns for follow-up.  Continue Toprol-XL 25 mg daily.  If blood pressure uncontrolled at follow-up, consider adding thiazide diuretic versus amlodipine.

## 2022-11-12 NOTE — Assessment & Plan Note (Signed)
STOP-Bang Score:  7     Arrange home sleep study.

## 2022-11-12 NOTE — Assessment & Plan Note (Signed)
He notes a long hx of shortness of breath that has gotten worse in the last couple of weeks. I had him do an echocardiogram in May 2023 b/c of some shortness of breath he noted after eating. He also notes fatigue but no chest pain. He has no signs of CHF on exam. His BP is uncontrolled. He is certainly at risk for (HFpEF) heart failure with preserved ejection fraction or reduced EF with uncontrolled BP. He snores and likely has sleep apnea which may be contributing to some of his symptoms. He has not had chest pain but with his hx of coronary artery disease, ischemia should be ruled out. He just had COVID. His lung exam is normal. However, given his recent illness, I will also get a CXR to rule out infection, edema.  CMET, CBC, TSH, BNP Echocardiogram  Exercise Myoview CXR Home sleep study F/u 4 weeks

## 2022-11-12 NOTE — Telephone Encounter (Signed)
Kevin [name], [facility] ordered an Itamar sleep study while the pt was in the office today. Pt agreeable to signed waiver and not open the box until he has been called with the PIN#.

## 2022-11-13 ENCOUNTER — Telehealth (HOSPITAL_COMMUNITY): Payer: Self-pay | Admitting: *Deleted

## 2022-11-13 LAB — CBC
Hematocrit: 49.5 % (ref 37.5–51.0)
Hemoglobin: 17.1 g/dL (ref 13.0–17.7)
MCH: 29.5 pg (ref 26.6–33.0)
MCHC: 34.5 g/dL (ref 31.5–35.7)
MCV: 86 fL (ref 79–97)
Platelets: 277 10*3/uL (ref 150–450)
RBC: 5.79 x10E6/uL (ref 4.14–5.80)
RDW: 13.1 % (ref 11.6–15.4)
WBC: 11.1 10*3/uL — ABNORMAL HIGH (ref 3.4–10.8)

## 2022-11-13 LAB — COMPREHENSIVE METABOLIC PANEL
ALT: 56 IU/L — ABNORMAL HIGH (ref 0–44)
AST: 32 IU/L (ref 0–40)
Albumin/Globulin Ratio: 1.7 (ref 1.2–2.2)
Albumin: 4.8 g/dL (ref 3.8–4.9)
Alkaline Phosphatase: 126 IU/L — ABNORMAL HIGH (ref 44–121)
BUN/Creatinine Ratio: 11 (ref 9–20)
BUN: 11 mg/dL (ref 6–24)
Bilirubin Total: 0.4 mg/dL (ref 0.0–1.2)
CO2: 19 mmol/L — ABNORMAL LOW (ref 20–29)
Calcium: 9.8 mg/dL (ref 8.7–10.2)
Chloride: 100 mmol/L (ref 96–106)
Creatinine, Ser: 0.96 mg/dL (ref 0.76–1.27)
Globulin, Total: 2.8 g/dL (ref 1.5–4.5)
Glucose: 199 mg/dL — ABNORMAL HIGH (ref 70–99)
Potassium: 4.7 mmol/L (ref 3.5–5.2)
Sodium: 137 mmol/L (ref 134–144)
Total Protein: 7.6 g/dL (ref 6.0–8.5)
eGFR: 93 mL/min/{1.73_m2} (ref >59)

## 2022-11-13 LAB — PRO B NATRIURETIC PEPTIDE: NT-Pro BNP: <36 pg/mL (ref 0–210)

## 2022-11-13 LAB — TSH: TSH: 1.87 u[IU]/mL (ref 0.450–4.500)

## 2022-11-13 NOTE — Telephone Encounter (Signed)
Prior Authorization for TAMAR sent to MEDICAID via web portal. Tracking Number .  READY-Authorized-Approval Valid Through:11/13/2022 - 01/11/2023

## 2022-11-13 NOTE — Telephone Encounter (Signed)
Patient given detailed instructions per Myocardial Perfusion Study Information Sheet for the test on 11/17/2022 at 11:00. Patient notified to arrive 15 minutes early and that it is imperative to arrive on time for appointment to keep from having the test rescheduled.  If you need to cancel or reschedule your appointment, please call the office within 24 hours of your appointment. . Patient verbalized understanding.The use of Meridia is discussed. The BMI must be over 30% above normal. The patient must be monitored closely for the development of hypertension, a reversible side effect of this drug. Other side effects such as headaches and nausea are reviewed. Therapy can be given for a total course of 6-12 months if a 5 lb weight loss is achieved in the first 4 weeks of treatment.

## 2022-11-14 ENCOUNTER — Telehealth: Payer: Self-pay | Admitting: Licensed Clinical Social Worker

## 2022-11-14 NOTE — Telephone Encounter (Signed)
H&V Care Navigation CSW Progress Note  Clinical Social Worker contacted patient by phone to f/u again. Previously had provided pt with assistance options. Appears [employer] is actively following for Medicaid and has sent him paperwork to complete. No answer today at [phone], will f/u one more time unless pt returns my call before then.  Patient is participating in a Managed Medicaid Plan:  No, self pay only.   SDOH Screenings   Food Insecurity: No Food Insecurity (05/13/2022)  Housing: Low Risk  (05/13/2022)  Transportation Needs: No Transportation Needs (05/13/2022)  Financial Resource Strain: Medium Risk (05/13/2022)  Tobacco Use: Medium Risk (11/12/2022)   Westley Hummer, MSW, [place]  [phone]- work cell phone (preferred) [phone]- desk phone

## 2022-11-14 NOTE — Telephone Encounter (Signed)
Pt has been given PIN# [id number]. Pt will do study Monday night 11/17/22. Called and made the patient aware that he may proceed with the [facility] Sleep Study. PIN # provided to the patient. Patient made aware that he will be contacted after the test has been read with the results and any recommendations. Patient verbalized understanding and thanked me for the call.

## 2022-11-17 ENCOUNTER — Ambulatory Visit (HOSPITAL_COMMUNITY): Payer: 59 | Attending: Internal Medicine

## 2022-11-17 DIAGNOSIS — R0602 Shortness of breath: Secondary | ICD-10-CM | POA: Diagnosis not present

## 2022-11-17 DIAGNOSIS — R0683 Snoring: Secondary | ICD-10-CM | POA: Insufficient documentation

## 2022-11-17 DIAGNOSIS — R400 Somnolence: Secondary | ICD-10-CM | POA: Diagnosis not present

## 2022-11-17 DIAGNOSIS — I2510 Atherosclerotic heart disease of native coronary artery without angina pectoris: Secondary | ICD-10-CM | POA: Diagnosis not present

## 2022-11-17 LAB — MYOCARDIAL PERFUSION IMAGING
Angina Index: 0
Duke Treadmill Score: 5
Estimated workload: 6.1
Exercise duration (min): 4 min
Exercise duration (sec): 55 s
LV dias vol: 67 mL (ref 62–150)
LV sys vol: 22 mL
MPHR: 165 {beats}/min
Nuc Stress EF: 67 %
Peak HR: 142 {beats}/min
Percent HR: 86 %
Rest HR: 61 {beats}/min
Rest Nuclear Isotope Dose: 10.2 mCi
SDS: 1
SRS: 1
SSS: 2
ST Depression (mm): 0 mm
Stress Nuclear Isotope Dose: 31.9 mCi
TID: 0.73

## 2022-11-17 MED ORDER — REGADENOSON 0.4 MG/5ML IV SOLN: 0.4000 mg | Freq: Once | INTRAVENOUS | Status: AC

## 2022-11-17 MED ORDER — TECHNETIUM TC 99M TETROFOSMIN IV KIT
31.9000 | PACK | Freq: Once | INTRAVENOUS | Status: AC | PRN
  Administered 2022-11-17: 31.9 via INTRAVENOUS

## 2022-11-17 MED ORDER — TECHNETIUM TC 99M TETROFOSMIN IV KIT
10.2000 | PACK | Freq: Once | INTRAVENOUS | Status: AC | PRN
  Administered 2022-11-17: 10.2 via INTRAVENOUS

## 2022-11-18 ENCOUNTER — Telehealth: Payer: Self-pay | Admitting: *Deleted

## 2022-11-18 ENCOUNTER — Encounter (INDEPENDENT_AMBULATORY_CARE_PROVIDER_SITE_OTHER): Payer: 59 | Admitting: Cardiology

## 2022-11-18 ENCOUNTER — Encounter: Payer: Self-pay | Admitting: Physician Assistant

## 2022-11-18 ENCOUNTER — Ambulatory Visit (HOSPITAL_COMMUNITY): Payer: Self-pay

## 2022-11-18 DIAGNOSIS — R7989 Other specified abnormal findings of blood chemistry: Secondary | ICD-10-CM

## 2022-11-18 DIAGNOSIS — G4733 Obstructive sleep apnea (adult) (pediatric): Secondary | ICD-10-CM | POA: Diagnosis not present

## 2022-11-18 DIAGNOSIS — R739 Hyperglycemia, unspecified: Secondary | ICD-10-CM

## 2022-11-18 NOTE — Addendum Note (Signed)
Addended by: Gaetano Net on: 11/18/2022 09:34 AM   Modules accepted: Orders

## 2022-11-18 NOTE — Telephone Encounter (Signed)
-----   Message from Liliane Shi, PA-C sent at 11/13/2022  8:46 AM EST ----- Glucose high. Creatinine, K+, Hgb, TSH, BNP normal.  ALT slightly elevated. WBC slightly elevated but chronically elevated and stable. PLAN:  -See if he has a PCP. -If not, we need to refer him to one to f/u on sugar, elevated LFTs. -If he does not have a PCP, arrange hepatic US (Dx: elevated LFTs, rule out hepatic steatosis) -Also if he does not have a PCP, arrange Hgb A1c to be drawn - we can get it at his f/u visit.  Richardson Dopp, PA-C    11/13/2022 8:39 AM

## 2022-11-19 ENCOUNTER — Telehealth (HOSPITAL_BASED_OUTPATIENT_CLINIC_OR_DEPARTMENT_OTHER): Payer: Self-pay | Admitting: Licensed Clinical Social Worker

## 2022-11-19 NOTE — Telephone Encounter (Addendum)
H&V Care Navigation CSW Progress Note  Outreach had been started with pt and noted per this encounter/chart review pt still in need of PCP. Assisted pt with arranging a PCP appt with [facility] on 3/1 at 9:20am. Also discovered pt has insurance coverage through [employer] during assessment.   Patient is participating in a Managed Medicaid Plan:  No, Banker only  [place]: Food Insecurity Present (11/19/2022)  Housing: Medium Risk (11/19/2022)  Transportation Needs: No Transportation Needs (11/19/2022)  Utilities: Not At Risk (11/19/2022)  Financial Resource Strain: Medium Risk (11/19/2022)  Tobacco Use: Medium Risk (11/18/2022)    Westley Hummer, MSW, [place]  [phone]- work cell phone (preferred) [phone]- desk phone

## 2022-11-19 NOTE — Telephone Encounter (Signed)
H&V Care Navigation CSW Progress Note  Clinical Social Worker contacted patient by phone to f/u again on no PCP/no insurance listed. Was able to reach pt today at [phone], re-introduced self, role, reason for call. Pt remembers speaking at one time to this Probation officer last year. 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 He drives himself to appointments. His mother remains his emergency contact at this time. He does not think he has insurance and cant remember the last time he had coverage. I made him aware that [place] team with [employer] had sent him paperwork for Medicaid. He confirms he has received this but that he's been too tired to complete it. Encouraged pt to please complete that or call them if he does not feel he can manage getting that done without their assistance. If not eligible for Medicaid pt should be eligible for assistance through [employer]  and [employer].  We also discussed establishing with PCP through [facility] to ensure non cardiac needs are managed. Pt also encouraged to apply for SNAP benefits due to limited income. Pt agreeable to all of these options being mailed to his home address and follow up bring provided. He gave me permission to make him an appt with assistance of Opal Sidles [facility] clinics to see PCP. I have completed this and he has an appt on 3/1 at [facility] to see Durene Fruits at 9:20am (first available).   After speaking with pt I noted pt had active [employer] benefits. I f/u with financial counselor Shanon Rosser who was able to validate insurance in system and added to Epic chart. I texted pt at his request with PCP information I also inquired if he was aware of active [employer] benefits he has not yet responded to my message. I remain available.    Patient is participating in a Managed Medicaid Plan:  No, self pay (possibly has Aetna benefits?)  SDOH  Screenings   Food Insecurity: Food Insecurity Present (11/19/2022)  Housing: Medium Risk (11/19/2022)  Transportation Needs: No Transportation Needs (11/19/2022)  Utilities: Not At Risk (11/19/2022)  Financial Resource Strain: Medium Risk (11/19/2022)  Tobacco Use: Medium Risk (11/18/2022)   Westley Hummer, MSW, [place]  [phone]- work cell phone (preferred) [phone]- desk phone

## 2022-11-19 NOTE — Telephone Encounter (Signed)
H&V Care Navigation CSW Progress Note  Clinical Social Worker  received return text from pt  to confirm pt has received information about appt. Also he shared he didn't realize he had insurance at this time. LCSW provided him two member services lines found to see if he can get a card mailed to him. [phone] and [phone]. No additional questions at this time, I remain available.   Patient is participating in a Managed Medicaid Plan:  No, Cabin crew.   SDOH Screenings   Food Insecurity: Food Insecurity Present (11/19/2022)  Housing: Medium Risk (11/19/2022)  Transportation Needs: No Transportation Needs (11/19/2022)  Utilities: Not At Risk (11/19/2022)  Financial Resource Strain: Medium Risk (11/19/2022)  Tobacco Use: Medium Risk (11/18/2022)   Westley Hummer, MSW, [place]  [phone]- work cell phone (preferred) [phone]- desk phone

## 2022-11-22 NOTE — Procedures (Signed)
SLEEP STUDY REPORT Patient Information Study Date: 11/18/2022 Patient Name: Kevin [name] Patient ID: [id number] Birth Date: Mar 21, 1966 Age: 57 Gender: Male BMI: 37.4 (W=282 lb, H=6' 1'') Stopbang: 7 Referring Physician: Richardson Dopp, PA  TEST DESCRIPTION:  Home sleep apnea testing was completed using the WatchPat, a Type 1 device, utilizing peripheral arterial tonometry (PAT), chest movement, actigraphy, pulse oximetry, pulse rate, body position and snore.  AHI was calculated with apnea and hypopnea using valid sleep time as the denominator. RDI includes apneas, hypopneas, and RERAs.  The data acquired and the scoring of sleep and all associated events were performed in accordance with the recommended standards and specifications as outlined in the AASM Manual for the Scoring of Sleep and Associated Events 2.2.0 (2015).  FINDINGS:  1.  Moderate Obstructive Sleep Apnea with AHI 16.6/hr.   2.  No Central Sleep Apnea with pAHIc 1.2/hr.  3.  Oxygen desaturations as low as 89%.  4.  Minimal snoring was present. O2 sats were < 88% for 0 min.  5.  Total sleep time was 7 hrs and 12 min.  6.  16.5% of total sleep time was spent in REM sleep.   7.  Shortened sleep onset latency at 5 min  8.  Shortened REM sleep onset latency at 81 min.   9.  Total awakenings were 4.  10. Arrhythmia detection:  None  DIAGNOSIS:   Moderate Obstructive Sleep Apnea (G47.33)  RECOMMENDATIONS:   1.  Clinical correlation of these findings is necessary.  The decision to treat obstructive sleep apnea (OSA) is usually based on the presence of apnea symptoms or the presence of associated medical conditions such as Hypertension, Congestive Heart Failure, Atrial Fibrillation or Obesity.  The most common symptoms of OSA are snoring, gasping for breath while sleeping, daytime sleepiness and fatigue.   2.  Initiating apnea therapy is recommended given the presence of symptoms and/or associated conditions. Recommend  proceeding with one of the following:     a.  Auto-CPAP therapy with a pressure range of 5-20cm H2O.     b.  An oral appliance (OA) that can be obtained from certain dentists with expertise in sleep medicine.  These are primarily of use in non-obese patients with mild and moderate disease.     c.  An ENT consultation which may be useful to look for specific causes of obstruction and possible treatment options.     d.  If patient is intolerant to PAP therapy, consider referral to ENT for evaluation for hypoglossal nerve stimulator.   3.  Close follow-up is necessary to ensure success with CPAP or oral appliance therapy for maximum benefit.  4.  A follow-up oximetry study on CPAP is recommended to assess the adequacy of therapy and determine the need for supplemental oxygen or the potential need for Bi-level therapy.  An arterial blood gas to determine the adequacy of baseline ventilation and oxygenation should also be considered.  5.  Healthy sleep recommendations include:  adequate nightly sleep (normal 7-9 hrs/night), avoidance of caffeine after noon and alcohol near bedtime, and maintaining a sleep environment that is cool, dark and quiet.  6.  Weight loss for overweight patients is recommended.  Even modest amounts of weight loss can significantly improve the severity of sleep apnea.  7.  Snoring recommendations include:  weight loss where appropriate, side sleeping, and avoidance of alcohol before bed.  8.  Operation of motor vehicle should not be performed when sleepy.  Signature: Fransico Him, MD;  [facility]; [place], Tax adviser of Sleep Medicine Electronically Signed: 11/22/2022

## 2022-11-23 ENCOUNTER — Ambulatory Visit (HOSPITAL_BASED_OUTPATIENT_CLINIC_OR_DEPARTMENT_OTHER)
Admission: RE | Admit: 2022-11-23 | Discharge: 2022-11-23 | Disposition: A | Discharge status: Home / Self Care | Payer: 59 | Source: Ambulatory Visit | Attending: Physician Assistant | Admitting: Physician Assistant

## 2022-11-23 DIAGNOSIS — R945 Abnormal results of liver function studies: Secondary | ICD-10-CM | POA: Diagnosis not present

## 2022-11-23 DIAGNOSIS — R7989 Other specified abnormal findings of blood chemistry: Secondary | ICD-10-CM | POA: Insufficient documentation

## 2022-11-23 DIAGNOSIS — K760 Fatty (change of) liver, not elsewhere classified: Secondary | ICD-10-CM | POA: Diagnosis not present

## 2022-11-24 ENCOUNTER — Telehealth: Payer: Self-pay | Admitting: *Deleted

## 2022-11-24 ENCOUNTER — Other Ambulatory Visit (HOSPITAL_COMMUNITY): Payer: Self-pay

## 2022-11-24 DIAGNOSIS — K760 Fatty (change of) liver, not elsewhere classified: Secondary | ICD-10-CM

## 2022-11-24 NOTE — Telephone Encounter (Signed)
-----   Message from Liliane Shi, [place] sent at 11/24/2022 12:22 PM EST ----- Please refer to GI for hepatic steatosis  Richardson Dopp, PA-C    11/24/2022 12:19 PM

## 2022-11-27 ENCOUNTER — Telehealth: Payer: Self-pay | Admitting: *Deleted

## 2022-11-27 DIAGNOSIS — G4733 Obstructive sleep apnea (adult) (pediatric): Secondary | ICD-10-CM

## 2022-11-27 DIAGNOSIS — R0683 Snoring: Secondary | ICD-10-CM

## 2022-11-27 DIAGNOSIS — R400 Somnolence: Secondary | ICD-10-CM

## 2022-11-27 DIAGNOSIS — I2510 Atherosclerotic heart disease of native coronary artery without angina pectoris: Secondary | ICD-10-CM

## 2022-11-27 NOTE — Telephone Encounter (Signed)
-----   Message from Lauralee Evener, [place] sent at 11/24/2022  8:42 AM EST -----  ----- Message ----- From: Sueanne Margarita, MD Sent: 11/22/2022   9:42 AM EST To: Cv Div Sleep Studies  Please let patient know that they have sleep apnea and recommend treating with CPAP.  Please order an auto CPAP from 4-15cm H2O with heated humidity and mask of choice.  Order overnight pulse ox on CPAP.  Followup with me in 6 weeks.

## 2022-11-27 NOTE — Addendum Note (Signed)
Addended by: Freada Bergeron on: 11/27/2022 05:17 PM   Modules accepted: Orders

## 2022-11-27 NOTE — Telephone Encounter (Signed)
The patient has been notified of the result. Left detailed message on voicemail and informed patient to call back..Mry Lamia Green, CMA   

## 2022-11-28 NOTE — Telephone Encounter (Signed)
The patient has been notified of the result and verbalized understanding.  All questions (if any) were answered. Kevin [name], [place] 11/28/2022 10:07 AM    Patient has declined the cpap machine stating he can not wear anything on his face. He will talk to scott at his next visit.

## 2022-12-02 NOTE — Progress Notes (Deleted)
  Subjective:    Kevin [name] - 57 y.o. male MRN [id number]  Date of birth: May 28, 1966  HPI  Kevin [name] is to establish care.   Current issues and/or concerns: Cards - CAD, HTN, HLD, shortness of breath, daytime sleepiness/sleep study ordered   ROS per HPI     Health Maintenance:  Health Maintenance Due  Topic Date Due   COVID-19 Vaccine (1) Never done   Hepatitis C Screening  Never done   DTaP/Tdap/Td (1 - Tdap) Never done   COLONOSCOPY (Pts 45-78yr Insurance coverage will need to be confirmed)  Never done   Zoster Vaccines- Shingrix (1 of 2) Never done   INFLUENZA VACCINE  Never done     Past Medical History: Patient Active Problem List   Diagnosis Date Noted   Snoring 11/12/2022   Daytime sleepiness 11/12/2022   SOB (shortness of breath) 02/05/2022   Fatigue 02/05/2022   Leg wound, right 06/22/2020   Coronary artery disease involving native coronary artery of native heart without angina pectoris 04/15/2019   Hyperlipidemia 04/06/2019   Essential hypertension 04/06/2019   Pre-diabetes 04/06/2019   s/p NSTEMI in 03/2019 04/05/2019      Social History   reports that he quit smoking about 16 years ago. His smoking use included cigarettes. He has never used smokeless tobacco. He reports that he does not currently use alcohol. He reports that he does not use drugs.   Family History  family history includes Bladder Cancer in his father; CAD in his maternal grandfather and paternal grandmother; Diabetes in his father, maternal grandmother, and paternal grandmother; Hyperlipidemia in his mother; Hypertension in his father, maternal grandfather, and maternal grandmother; Kidney disease in his maternal grandmother.   Medications: reviewed and updated   Objective:   Physical Exam There were no vitals taken for this visit. Physical Exam      Assessment & Plan:         Patient was given clear instructions to go to Emergency Department or return to  medical center if symptoms don't improve, worsen, or new problems develop.The patient verbalized understanding.  I discussed the assessment and treatment plan with the patient. The patient was provided an opportunity to ask questions and all were answered. The patient agreed with the plan and demonstrated an understanding of the instructions.   The patient was advised to call back or seek an in-person evaluation if the symptoms worsen or if the condition fails to improve as anticipated.    ADurene Fruits NP 12/02/2022, 8:05 AM [facility]

## 2022-12-05 ENCOUNTER — Ambulatory Visit: Payer: Self-pay | Admitting: Family

## 2022-12-05 DIAGNOSIS — Z7689 Persons encountering health services in other specified circumstances: Secondary | ICD-10-CM

## 2022-12-09 ENCOUNTER — Ambulatory Visit (HOSPITAL_COMMUNITY): Payer: 59 | Attending: Physician Assistant

## 2022-12-09 DIAGNOSIS — I2510 Atherosclerotic heart disease of native coronary artery without angina pectoris: Secondary | ICD-10-CM

## 2022-12-09 DIAGNOSIS — R0602 Shortness of breath: Secondary | ICD-10-CM

## 2022-12-09 DIAGNOSIS — R400 Somnolence: Secondary | ICD-10-CM | POA: Diagnosis not present

## 2022-12-09 DIAGNOSIS — R0683 Snoring: Secondary | ICD-10-CM

## 2022-12-09 LAB — ECHOCARDIOGRAM COMPLETE
Area-P 1/2: 3.78 cm2
S' Lateral: 2.3 cm

## 2022-12-12 ENCOUNTER — Telehealth: Payer: Self-pay | Admitting: Physician Assistant

## 2022-12-12 ENCOUNTER — Telehealth: Payer: Self-pay | Admitting: *Deleted

## 2022-12-12 DIAGNOSIS — G4730 Sleep apnea, unspecified: Secondary | ICD-10-CM

## 2022-12-12 DIAGNOSIS — R0683 Snoring: Secondary | ICD-10-CM

## 2022-12-12 DIAGNOSIS — R400 Somnolence: Secondary | ICD-10-CM

## 2022-12-12 NOTE — Telephone Encounter (Signed)
Was asked to place a referral for pt to see Dr. Radford Pax for sleep.

## 2022-12-12 NOTE — Telephone Encounter (Signed)
Referral placed at this time to [facility] for sleep apnea.

## 2022-12-12 NOTE — Telephone Encounter (Signed)
-----   Message from Joni Reining, RN sent at 12/12/2022  8:04 AM EST ----- Regarding: FW: Refusing CPAP? Hello,   I received a message that this patient was requesting to get in with Dr. Radford Pax to discuss alternatives to cpap. I see Dr. Radford Pax read his sleep study but I don't see that he's had any other visits with her. She recommended cpap back in February but according to Gae Bon he refused it. I don't see a current referral to Dr. Radford Pax in epic. If someone wants to put in a referral for Dr. Radford Pax for sleep apnea I can try to get him scheduled to see her in April.   Danae Chen, RN ----- Message ----- From: Freada Bergeron, CMA Sent: 12/10/2022   5:44 PM EST To: Joni Reining, RN Subject: RE: Refusing CPAP?                             You're right he has to try cpap for 6 months and fail it first. He has never seen Dr Radford Pax and he declined his cpap. ----- Message ----- From: Joni Reining, RN Sent: 12/10/2022   9:48 AM EST To: Freada Bergeron, CMA; Jeanann Lewandowsky, RMA Subject: Refusing CPAP?                                 HI Gae Bon, I see he just had a home sleep study 11/18/22. Has he ever been on cpap before? I can schedule him with [facility] when she comes back but I don't see that he's ever seen her before or that there's a referral. Looks like he usually sees [facility] so I can get Nicki Reaper to put the referral in but I'm suspicious that it's a "I've never tried Cpap and don't know anything about it" kind of issue. Won't most insurances only cover Inspire if they've tried cpap and failed? Danae Chen ----- Message ----- From: Jeanann Lewandowsky, RMA Sent: 12/10/2022   9:40 AM EST To: Freada Bergeron, CMA; Joni Reining, RN  Pt needs to see Dr. Radford Pax for sleep, as cannot wear anything on his face, but would like to discuss alternatives?

## 2022-12-12 NOTE — Telephone Encounter (Signed)
-----   Message from Liliane Shi, [place] sent at 12/12/2022  9:44 AM EST ----- Regarding: RE: Refusing CPAP? Please refer to Dr. Radford Pax for OSA. Richardson Dopp, PA-C    12/12/2022 9:45 AM   ----- Message ----- From: Joni Reining, RN Sent: 12/12/2022   8:18 AM EST To: Liliane Shi, PA-C; [place], [facility] Subject: FW: Refusing CPAP?                             Hello,   I received a message that this patient was requesting to get in with Dr. Radford Pax to discuss alternatives to cpap. I see Dr. Radford Pax read his sleep study but I don't see that he's had any other visits with her. She recommended cpap back in February but according to Gae Bon he refused it. I don't see a current referral to Dr. Radford Pax in epic. If someone wants to put in a referral for Dr. Radford Pax for sleep apnea I can try to get him scheduled to see her in April.   Danae Chen, RN ----- Message ----- From: Freada Bergeron, CMA Sent: 12/10/2022   5:44 PM EST To: Joni Reining, RN Subject: RE: Refusing CPAP?                             You're right he has to try cpap for 6 months and fail it first. He has never seen Dr Radford Pax and he declined his cpap. ----- Message ----- From: Joni Reining, RN Sent: 12/10/2022   9:48 AM EST To: Freada Bergeron, CMA; Jeanann Lewandowsky, RMA Subject: Refusing CPAP?                                 HI Gae Bon, I see he just had a home sleep study 11/18/22. Has he ever been on cpap before? I can schedule him with Turner when she comes back but I don't see that he's ever seen her before or that there's a referral. Looks like he usually sees [facility] so I can get Nicki Reaper to put the referral in but I'm suspicious that it's a "I've never tried Cpap and don't know anything about it" kind of issue. Won't most insurances only cover Inspire if they've tried cpap and failed? Danae Chen ----- Message ----- From: Jeanann Lewandowsky, RMA Sent: 12/10/2022   9:40 AM EST To: Freada Bergeron, CMA; Joni Reining, RN  Pt needs to see Dr.  Radford Pax for sleep, as cannot wear anything on his face, but would like to discuss alternatives?

## 2023-01-08 ENCOUNTER — Telehealth: Payer: Self-pay | Admitting: *Deleted

## 2023-01-08 ENCOUNTER — Encounter: Payer: Self-pay | Admitting: Cardiology

## 2023-01-08 ENCOUNTER — Ambulatory Visit: Payer: 59 | Attending: Cardiology | Admitting: Cardiology

## 2023-01-08 VITALS — Ht 72.0 in | Wt 280.0 lb

## 2023-01-08 DIAGNOSIS — R0683 Snoring: Secondary | ICD-10-CM

## 2023-01-08 DIAGNOSIS — I10 Essential (primary) hypertension: Secondary | ICD-10-CM

## 2023-01-08 DIAGNOSIS — R400 Somnolence: Secondary | ICD-10-CM

## 2023-01-08 DIAGNOSIS — G4733 Obstructive sleep apnea (adult) (pediatric): Secondary | ICD-10-CM

## 2023-01-08 NOTE — Patient Instructions (Signed)
Medication Instructions:  Your physician recommends that you continue on your current medications as directed. Please refer to the Current Medication list given to you today.  *If you need a refill on your cardiac medications before your next appointment, please call your pharmacy*   Lab Work: None.  If you have labs (blood work) drawn today and your tests are completely normal, you will receive your results only by: [place] (if you have MyChart) OR A paper copy in the mail If you have any lab test that is abnormal or we need to change your treatment, we will call you to review the results.   Testing/Procedures: You do not have any testing or procedures scheduled, but Dr. Radford Pax has ordered a CPAP device for you. Our sleep coordinator will reach out to you once insurance has approved this device for you.   Follow-Up:  Your next appointment will be scheduled after your new CPAP device is delivered and it will be with:    Provider:   Dr. Fransico Him, MD

## 2023-01-08 NOTE — Telephone Encounter (Signed)
Mask desensitization ordered with sleep lab  order placed for a ResME auto CPAP from 4 to 15cm H2O with heated humidity and nasal pillow mask -   Upon patient request DME selection is [facility]. Patient understands he will be contacted by [facility] to set up his cpap. Patient understands to call if [facility] does not contact him with new setup in a timely manner. Patient understands they will be called once confirmation has been received from [employer]/ that they have received their new machine to schedule 10 week follow up appointment.   [facility] notified of new cpap order  Please add to airview Patient was grateful for the call and thanked me.

## 2023-01-08 NOTE — Progress Notes (Signed)
SLeep Medicine Consult  via Video Note   Because of Kevin [name]'s co-morbid illnesses, he is at least at moderate risk for complications without adequate follow up.  This format is felt to be most appropriate for this patient at this time.  All issues noted in this document were discussed and addressed.  A limited physical exam was performed with this format.  Please refer to the patient's chart for his consent to telehealth for [facility].      Date:  01/08/2023   ID:  Kevin [name], DOB July 03, 1966, MRN [id number] The patient was identified using 2 identifiers.  Patient Location: Home Provider Location: Home Office   PCP:  Patient, No Pcp Per   [facility] Providers Cardiologist:  Sherren Mocha, MD     Evaluation Performed:  Consultation - Kevin [name] was referred by Richardson Dopp, PA for the evaluation of obstructive sleep apnea.  Chief Complaint: Obstructive sleep apnea  History of Present Illness:    Kevin [name] is a 57 y.o. male who is being seen today for the evaluation of obstructive sleep apnea at the request of Richardson [name], [place].  Kevin [name] is a 57 y.o. male with a history of ASCHD status post NSTEMI, hyperlipidemia, hypertension and prediabetes.  When she saw Richardson [name] in February 2024 he complained of problems with snoring.  Given poorly controlled hypertension and her symptoms that were Consistent with obstructive sleep apnea at home sleep study was ordered.  He underwent home sleep study on 11/18/2022 showing moderate obstructive sleep apnea with an AHI of 16.6/h and no central events.  CPAP was recommended but patient declined stating that he could not wear anything on his face.  He is now referred for sleep medicine consultation to discuss other sleep apnea treatments.  He tells me that he feels tired in the am when he wakes up but does not feel sleepy during the day.  He has never been told that he stops breathing  in his sleep.  He does not think that he snores.     Past Medical History:  Diagnosis Date   Coronary artery disease    a. 04/05/19 NSTEMI, DES to pLAD, // Myoview 11/17/22: EF 67, normal perfusion; low risk   Hyperlipidemia LDL goal <70    Hypertension    NSTEMI (non-ST elevated myocardial infarction) 04/05/2019   OSA (obstructive sleep apnea)    home sleep study on 11/18/2022 showing moderate obstructive sleep apnea with an AHI of 16.6/h and no central events.   Pre-diabetes    Past Surgical History:  Procedure Laterality Date   CORONARY STENT INTERVENTION N/A 04/05/2019   Procedure: CORONARY STENT INTERVENTION;  Surgeon: Sherren Mocha, MD;  Location: [facility];  Service: Cardiovascular;  Laterality: N/A;   LEFT HEART CATH AND CORONARY ANGIOGRAPHY N/A 04/05/2019   Procedure: LEFT HEART CATH AND CORONARY ANGIOGRAPHY;  Surgeon: Sherren Mocha, MD;  Location: [facility];  Service: Cardiovascular;  Laterality: N/A;     Current Meds  Medication Sig   aspirin 81 MG chewable tablet Chew 1 tablet (81 mg total) by mouth daily.   losartan (COZAAR) 100 MG tablet Take 1 tablet (100 mg total) by mouth daily.   metoprolol tartrate (LOPRESSOR) 25 MG tablet Take 25 mg by mouth 2 (two) times daily.   rosuvastatin (CRESTOR) 40 MG tablet Take 1 tablet (40 mg total) by mouth daily.   [DISCONTINUED] metoprolol succinate (TOPROL XL)  25 MG 24 hr tablet Take 1 tablet (25 mg total) by mouth daily.     Allergies:   Patient has no known allergies.   Social History   Tobacco Use   Smoking status: Former    Types: Cigarettes    Quit date: 10/06/2006    Years since quitting: 16.2   Smokeless tobacco: Never  Vaping Use   Vaping Use: Never used  Substance Use Topics   Alcohol use: Not Currently    Comment:     Drug use: No     Family Hx: The patient's family history includes Bladder Cancer in his father; CAD in his maternal grandfather and paternal grandmother; Diabetes in his  father, maternal grandmother, and paternal grandmother; Hyperlipidemia in his mother; Hypertension in his father, maternal grandfather, and maternal grandmother; Kidney disease in his maternal grandmother.  ROS:   Please see the history of present illness.     All other systems reviewed and are negative.   Prior Sleep studies:   The following studies were reviewed today:  Home sleep study  Labs/Other Tests and Data Reviewed:     Recent Labs: 11/12/2022: ALT 56; BUN 11; Creatinine, Ser 0.96; Hemoglobin 17.1; NT-Pro BNP <36; Platelets 277; Potassium 4.7; Sodium 137; TSH 1.870    Wt Readings from Last 3 Encounters:  01/08/23 280 lb (127 kg)  11/17/22 282 lb (127.9 kg)  11/12/22 282 lb 12.8 oz (128.3 kg)     Risk Assessment/Calculations:      STOP-Bang Score:  7      Objective:    Vital Signs:  Ht 6' (1.829 m)   Wt 280 lb (127 kg)   BMI 37.97 kg/m    VITAL SIGNS:  reviewed GEN:  no acute distress EYES:  sclerae anicteric, EOMI - Extraocular Movements Intact RESPIRATORY:  normal respiratory effort, symmetric expansion CARDIOVASCULAR:  no peripheral edema SKIN:  no rash, lesions or ulcers. MUSCULOSKELETAL:  no obvious deformities. NEURO:  alert and oriented x 3, no obvious focal deficit PSYCH:  normal affect  ASSESSMENT & PLAN:    OSA -Home sleep study demonstrated moderate obstructive sleep apnea with an AHI of 16.6/h and no central events.  CPAP was recommended but patient declined stating that he could not wear anything on his face -Unfortunately he is not a candidate for the inspire device given his high BMI.  He also would have to try CPAP for 6 months prior to being considered a candidate -He says he cannot keep anything on his face but I have recommended a trial of a 2-week mask desensitization through the sleep lab and then a formal CPAP titration -he is willing to try an auto CPAP with nasal pillow mask but I think he would benefit from a mask desensitization  first  Hypertension -Continue prescription drug management with losartan 100 mg daily and Toprol-XL 25 mg daily with as needed refills  Time:   Today, I have spent 15 minutes with the patient with telehealth technology discussing the above problems.     Medication Adjustments/Labs and Tests Ordered: Current medicines are reviewed at length with the patient today.  Concerns regarding medicines are outlined above.   Tests Ordered: No orders of the defined types were placed in this encounter.   Medication Changes: No orders of the defined types were placed in this encounter.   Follow Up:  In Person in 6 week(s) after he gets his CPAP  Signed, Fransico Him, MD  01/08/2023 3:00 PM    [facility]

## 2023-01-08 NOTE — Telephone Encounter (Signed)
-----   Message from Joni Reining, RN sent at 01/08/2023  3:04 PM EDT ----- Regarding: New CPAP Hello,   Orders from Dr. Radford Pax:  "order a ResME auto CPAP from 4 to 15cm H2O with heated humidity and nasal pillow mask - also order a mask desensitization through the sleep lab before starting CPAP and then followup 6 weeks after he gets CPAP"  Thanks, Danae Chen

## 2023-01-28 ENCOUNTER — Ambulatory Visit: Payer: 59 | Attending: Physician Assistant | Admitting: Physician Assistant

## 2023-01-28 ENCOUNTER — Encounter: Payer: Self-pay | Admitting: Physician Assistant

## 2023-01-28 ENCOUNTER — Other Ambulatory Visit (HOSPITAL_COMMUNITY): Payer: Self-pay

## 2023-01-28 VITALS — BP 148/96 | HR 66 | Ht 73.0 in | Wt 283.2 lb

## 2023-01-28 DIAGNOSIS — R0602 Shortness of breath: Secondary | ICD-10-CM | POA: Diagnosis not present

## 2023-01-28 DIAGNOSIS — I10 Essential (primary) hypertension: Secondary | ICD-10-CM

## 2023-01-28 DIAGNOSIS — G4733 Obstructive sleep apnea (adult) (pediatric): Secondary | ICD-10-CM | POA: Diagnosis not present

## 2023-01-28 DIAGNOSIS — I2510 Atherosclerotic heart disease of native coronary artery without angina pectoris: Secondary | ICD-10-CM | POA: Diagnosis not present

## 2023-01-28 DIAGNOSIS — R739 Hyperglycemia, unspecified: Secondary | ICD-10-CM

## 2023-01-28 DIAGNOSIS — K760 Fatty (change of) liver, not elsewhere classified: Secondary | ICD-10-CM | POA: Diagnosis not present

## 2023-01-28 DIAGNOSIS — E782 Mixed hyperlipidemia: Secondary | ICD-10-CM | POA: Diagnosis not present

## 2023-01-28 MED ORDER — AMLODIPINE BESYLATE 5 MG PO TABS
5.0000 mg | ORAL_TABLET | Freq: Every day | ORAL | 3 refills | Status: DC
  Filled 2023-01-28: qty 30, 30d supply, fill #0
  Filled 2023-03-26: qty 30, 30d supply, fill #1
  Filled 2023-06-09: qty 30, 30d supply, fill #2
  Filled 2023-08-03: qty 30, 30d supply, fill #3
  Filled 2023-10-19: qty 30, 30d supply, fill #4
  Filled 2023-12-31: qty 30, 30d supply, fill #5

## 2023-01-28 NOTE — Assessment & Plan Note (Signed)
As noted, I suspect his shortness of breath is multifactorial and related to coronary artery disease, deconditioning, obesity. His echocardiogram, Myoview, CXR were normal. I do not think we need to send him to Pulmonology yet. If his symptoms persist, we consider referral to Pulmonology.

## 2023-01-28 NOTE — Assessment & Plan Note (Addendum)
He notes he cannot afford CPAP. I will reach out to our sleep med team to see if there is any assistance available. Untreated OSA will continue to contribute to difficult to control hypertension.

## 2023-01-28 NOTE — Assessment & Plan Note (Signed)
Obtain Hgb A1c to screen for diabetes.

## 2023-01-28 NOTE — Assessment & Plan Note (Signed)
+  Elevated LFTs. Referral to GI pending.

## 2023-01-28 NOTE — Assessment & Plan Note (Addendum)
BP remains above target. He did have some small vessel disease on his cath in 2020 that is managed medically. I suspect his shortness of breath is multifactorial. However, if it is an anginal equivalent, adding a calcium channel blocker will help. Continue Losartan 100 mg once daily, Metoprolol tartrate 25 mg twice daily Start Amlodipine 5 mg once daily  BMET today F/u 3 mos

## 2023-01-28 NOTE — Assessment & Plan Note (Signed)
LDL in 05/2022 was optimal at 63. Continue Rosuvastatin 40 mg once daily.

## 2023-01-28 NOTE — Patient Instructions (Signed)
Medication Instructions:  Your physician has recommended you make the following change in your medication:   START Amlodipine 5 mg taking 1 daily *If you need a refill on your cardiac medications before your next appointment, please call your pharmacy*   Lab Work: TODAY:  BMET & HGBA1C  If you have labs (blood work) drawn today and your tests are completely normal, you will receive your results only by: MyChart Message (if you have MyChart) OR A paper copy in the mail If you have any lab test that is abnormal or we need to change your treatment, we will call you to review the results.   Testing/Procedures: None ordered   Follow-Up: At [facility], you and your health needs are our priority.  As part of our continuing mission to provide you with exceptional heart care, we have created designated Provider Care Teams.  These Care Teams include your primary Cardiologist (physician) and Advanced Practice Providers (APPs -  Physician Assistants and Nurse Practitioners) who all work together to provide you with the care you need, when you need it.  We recommend signing up for the patient portal called "MyChart".  Sign up information is provided on this After Visit Summary.  MyChart is used to connect with patients for Virtual Visits (Telemedicine).  Patients are able to view lab/test results, encounter notes, upcoming appointments, etc.  Non-urgent messages can be sent to your provider as well.   To learn more about what you can do with MyChart, go to [email or web address].    Your next appointment:   3 month(s)  Provider:   Tereso Newcomer, PA-C         Other Instructions

## 2023-01-28 NOTE — Assessment & Plan Note (Signed)
History of non-STEMI in June 2022 with a DES to the LAD.  He did have moderate residual disease in the distal LAD and moderate disease in the OM1 and RCA.  He had 70% stenosis in a small OM 2 treated medically.  He is not having chest pain. His recent Myoview was low risk and Echocardiogram demonstrated normal EF. Question if shortness of breath is an anginal equivalent. His BP is uncontrolled. Add Amlodipine 5 mg once daily. Continue ASA 81 mg once daily, Crestor 40 mg once daily, Metoprolol tartrate 25 mg twice daily.

## 2023-01-28 NOTE — Progress Notes (Signed)
Cardiology Office Note:    Date:  01/28/2023  ID:  Kevin [name], DOB 12-Jun-1966, MRN [id number] PCP: Patient, No Pcp Per  [facility] Providers Cardiologist:  Tonny Bollman, MD       Patient Profile:   Coronary artery disease  S/p NSTEMI 03/2019 >> PCI: 3 x 12 mm DES to pLAD LHC 04/05/2019: LAD ostial 95, mid 30, distal 50; OM1 50, OM2 70 (small); RCA proximal 50 Myoview 11/17/2022: EF 67, normal perfusion, low risk TTE 02/25/22: EF 60-65, no RWMA, normal RVSF, trivial MR, RAP 3 TTE 12/09/2022: EF 60-65, no RWMA, normal RVSF, mild LAE, RAP 3, no valvular disease Hyperlipidemia  Hypertension  Prediabetes  Sleep apnea    History of Present Illness:   Kevin [name] is a 57 y.o. male who returns for f/u of shortness of breath. He was last seen 11/12/22. He noted dyspnea on exertion.  He also noted snoring and fatigue.  Workup included extensive lab work.  Hemoglobin was normal at 17.1, potassium was normal at 4.7, creatinine was normal at 0.96.  BNP was normal.  TSH was normal.  Chest x-ray showed no acute disease.  Glucose was elevated at 199.  He does not have a PCP.  He will need a hemoglobin A1c obtained.  His liver enzymes were elevated (ALT 56).  Ultrasound demonstrated extensive hepatic steatosis.  He has been referred to gastroenterology.  Echocardiogram demonstrated normal LV function and normal RV function.  Myoview was low risk and negative for ischemia.  Sleep study was positive for OSA.  He is intolerant to facemask for CPAP.  He did see Dr. Mayford Knife earlier this month who has ordered auto CPAP with nasal pillow and mask desensitization.  He is here alone. He has not had chest pain, syncope, orthopnea, leg edema. He is still short of breath with exertion. He does not smoke. He smoked remotely years ago.   Review of Systems  Gastrointestinal:  Negative for hematochezia and melena.  Genitourinary:  Negative for hematuria.   See HPI    Studies Reviewed:    EKG:  not  done  Risk Assessment/Calculations:     HYPERTENSION CONTROL Vitals:   01/28/23 1515 01/28/23 1612  BP: (!) 143/82 (!) 148/96    The patient's blood pressure is elevated above target today.  In order to address the patient's elevated BP:       STOP-Bang Score:  7      Physical Exam:   VS:  BP (!) 148/96   Pulse 66   Ht  (1.854 m)   Wt 283 lb 3.2 oz (128.5 kg)   SpO2 97%   BMI 37.36 kg/m    Wt Readings from Last 3 Encounters:  01/28/23 283 lb 3.2 oz (128.5 kg)  01/08/23 280 lb (127 kg)  11/17/22 282 lb (127.9 kg)    Constitutional:      Appearance: Healthy appearance. Not in distress.  Neck:     Vascular: JVD normal.  Pulmonary:     Breath sounds: Normal breath sounds. No wheezing. No rales.  Cardiovascular:     Normal rate. Regular rhythm.     Murmurs: There is no murmur.  Edema:    Peripheral edema absent.  Abdominal:     Palpations: Abdomen is soft.       ASSESSMENT AND PLAN:   Essential hypertension BP remains above target. He did have some small vessel disease on his cath in 2020 that is managed medically. I suspect his  shortness of breath is multifactorial. However, if it is an anginal equivalent, adding a calcium channel blocker will help. Continue Losartan 100 mg once daily, Metoprolol tartrate 25 mg twice daily Start Amlodipine 5 mg once daily  BMET today F/u 3 mos  SOB (shortness of breath) As noted, I suspect his shortness of breath is multifactorial and related to coronary artery disease, deconditioning, obesity. His echocardiogram, Myoview, CXR were normal. I do not think we need to send him to Pulmonology yet. If his symptoms persist, we consider referral to Pulmonology.   Hyperglycemia Obtain Hgb A1c to screen for diabetes.  CAD (coronary artery disease) History of non-STEMI in June 2022 with a DES to the LAD.  He did have moderate residual disease in the distal LAD and moderate disease in the OM1 and RCA.  He had 70% stenosis in a small  OM 2 treated medically.  He is not having chest pain. His recent Myoview was low risk and Echocardiogram demonstrated normal EF. Question if shortness of breath is an anginal equivalent. His BP is uncontrolled. Add Amlodipine 5 mg once daily. Continue ASA 81 mg once daily, Crestor 40 mg once daily, Metoprolol tartrate 25 mg twice daily.  Hyperlipidemia LDL in 05/2022 was optimal at 63. Continue Rosuvastatin 40 mg once daily.   Hepatic steatosis +Elevated LFTs. Referral to GI pending.   OSA (obstructive sleep apnea) He notes he cannot afford CPAP. I will reach out to our sleep med team to see if there is any assistance available. Untreated OSA will continue to contribute to difficult to control hypertension.       Dispo:  Return in about 3 months (around 04/29/2023) for Routine Follow Up, w/ Tereso Newcomer, PA-C.  Signed, Tereso Newcomer, PA-C

## 2023-01-29 ENCOUNTER — Encounter: Payer: Self-pay | Admitting: Internal Medicine

## 2023-01-29 LAB — BASIC METABOLIC PANEL
BUN/Creatinine Ratio: 9 (ref 9–20)
BUN: 10 mg/dL (ref 6–24)
CO2: 20 mmol/L (ref 20–29)
Calcium: 9.8 mg/dL (ref 8.7–10.2)
Chloride: 98 mmol/L (ref 96–106)
Creatinine, Ser: 1.07 mg/dL (ref 0.76–1.27)
Glucose: 253 mg/dL — ABNORMAL HIGH (ref 70–99)
Potassium: 4.6 mmol/L (ref 3.5–5.2)
Sodium: 136 mmol/L (ref 134–144)
eGFR: 82 mL/min/{1.73_m2} (ref >59)

## 2023-01-29 LAB — HEMOGLOBIN A1C
Est. average glucose Bld gHb Est-mCnc: 249 mg/dL
Hgb A1c MFr Bld: 10.3 % — ABNORMAL HIGH (ref 4.8–5.6)

## 2023-02-02 ENCOUNTER — Telehealth: Payer: Self-pay | Admitting: *Deleted

## 2023-02-02 ENCOUNTER — Other Ambulatory Visit (HOSPITAL_COMMUNITY): Payer: Self-pay

## 2023-02-02 DIAGNOSIS — Z79899 Other long term (current) drug therapy: Secondary | ICD-10-CM

## 2023-02-02 MED ORDER — METFORMIN HCL ER (OSM) 500 MG PO TB24
500.0000 mg | ORAL_TABLET | Freq: Every day | ORAL | 0 refills | Status: DC
  Filled 2023-02-02: qty 60, 60d supply, fill #0

## 2023-02-02 MED ORDER — METFORMIN HCL 500 MG PO TABS
500.0000 mg | ORAL_TABLET | Freq: Two times a day (BID) | ORAL | 0 refills | Status: DC
  Filled 2023-02-02: qty 60, 30d supply, fill #0

## 2023-02-02 NOTE — Telephone Encounter (Signed)
-----   Message from Beatrice Lecher, [place] sent at 01/29/2023  8:07 AM EDT ----- Creatinine, K+ normal A1c consistent with diabetes mellitus. PLAN:  -Start Metformin 500 mg twice daily -CMET in 1 month -Keep appt with new PCP in May to further manage diabetes. Tereso Newcomer, PA-C    01/29/2023 8:00 AM

## 2023-02-02 NOTE — Addendum Note (Signed)
Addended by: Burnetta Sabin on: 02/02/2023 01:42 PM   Modules accepted: Orders

## 2023-02-03 ENCOUNTER — Telehealth: Payer: Self-pay | Admitting: *Deleted

## 2023-02-03 NOTE — Telephone Encounter (Signed)
Message sent to Henderson Newcomer at [facility] to email a form to [place].

## 2023-02-03 NOTE — Telephone Encounter (Signed)
-----   Message from Burna Sis, [place] sent at 02/02/2023 10:22 AM EDT ----- [employer] has a hardship program he can apply for to see if they will reduce his copay- he should be able to ask [employer] about this program and be sent an application.  Also we can potentially pay for an initial payment up to $250 for the machine itself but any ongoing costs we are not able to assist with.   ----- Message ----- From: Reesa Chew, CMA Sent: 02/02/2023  10:14 AM EDT To: Reesa Chew, CMA; Beatrice Lecher, PA-C; #  Is there any help for this patient? ----- Message ----- From: Kennon Rounds Sent: 01/28/2023   3:56 PM EDT To: Quintella Reichert, MD; Reesa Chew, CMA  [place] I saw him today. He cannot afford CPAP. Are there any options to help him pay for it? Thanks [employer]

## 2023-02-17 ENCOUNTER — Ambulatory Visit: Payer: 59 | Admitting: Cardiology

## 2023-02-18 ENCOUNTER — Other Ambulatory Visit (HOSPITAL_COMMUNITY): Payer: Self-pay

## 2023-02-18 ENCOUNTER — Other Ambulatory Visit: Payer: Self-pay | Admitting: Physician Assistant

## 2023-02-18 MED ORDER — ROSUVASTATIN CALCIUM 40 MG PO TABS
40.0000 mg | ORAL_TABLET | Freq: Every day | ORAL | 3 refills | Status: DC
  Filled 2023-02-18: qty 30, 30d supply, fill #0
  Filled 2023-03-26: qty 30, 30d supply, fill #1
  Filled 2023-06-09: qty 30, 30d supply, fill #2
  Filled 2023-08-03: qty 30, 30d supply, fill #3
  Filled 2023-10-19: qty 30, 30d supply, fill #4
  Filled 2023-12-31: qty 30, 30d supply, fill #5

## 2023-02-19 ENCOUNTER — Other Ambulatory Visit (HOSPITAL_COMMUNITY): Payer: Self-pay

## 2023-02-23 NOTE — Progress Notes (Signed)
  Subjective:    Kevin [name] - 57 y.o. male MRN [id number]  Date of birth: 08-07-1966  HPI  Kevin [name] is to establish care.   Current issues and/or concerns: Cards  Gastro   ROS per HPI     Health Maintenance:  Health Maintenance Due  Topic Date Due   COVID-19 Vaccine (1) Never done   Hepatitis C Screening  Never done   DTaP/Tdap/Td (1 - Tdap) Never done   Zoster Vaccines- Shingrix (1 of 2) Never done   COLONOSCOPY (Pts 45-74yrs Insurance coverage will need to be confirmed)  Never done     Past Medical History: Patient Active Problem List   Diagnosis Date Noted   Hyperglycemia 01/28/2023   Hepatic steatosis 01/28/2023   OSA (obstructive sleep apnea) 01/08/2023   Snoring 11/12/2022   Daytime sleepiness 11/12/2022   SOB (shortness of breath) 02/05/2022   Fatigue 02/05/2022   Leg wound, right 06/22/2020   CAD (coronary artery disease) 04/15/2019   Hyperlipidemia 04/06/2019   Essential hypertension 04/06/2019   Pre-diabetes 04/06/2019   s/p NSTEMI in 03/2019 04/05/2019      Social History   reports that he quit smoking about 16 years ago. His smoking use included cigarettes. He has never used smokeless tobacco. He reports that he does not currently use alcohol. He reports that he does not use drugs.   Family History  family history includes Bladder Cancer in his father; CAD in his maternal grandfather and paternal grandmother; Diabetes in his father, maternal grandmother, and paternal grandmother; Hyperlipidemia in his mother; Hypertension in his father, maternal grandfather, and maternal grandmother; Kidney disease in his maternal grandmother.   Medications: reviewed and updated   Objective:   Physical Exam There were no vitals taken for this visit. Physical Exam      Assessment & Plan:         Patient was given clear instructions to go to Emergency Department or return to medical center if symptoms don't improve, worsen, or new problems  develop.The patient verbalized understanding.  I discussed the assessment and treatment plan with the patient. The patient was provided an opportunity to ask questions and all were answered. The patient agreed with the plan and demonstrated an understanding of the instructions.   The patient was advised to call back or seek an in-person evaluation if the symptoms worsen or if the condition fails to improve as anticipated.    Ricky Stabs, NP 02/23/2023, 9:23 AM Primary Care at [facility]

## 2023-02-25 ENCOUNTER — Ambulatory Visit (INDEPENDENT_AMBULATORY_CARE_PROVIDER_SITE_OTHER): Payer: 59 | Admitting: Family

## 2023-02-25 ENCOUNTER — Encounter: Payer: Self-pay | Admitting: Family

## 2023-02-25 ENCOUNTER — Other Ambulatory Visit (HOSPITAL_COMMUNITY): Payer: Self-pay

## 2023-02-25 VITALS — BP 150/96 | HR 74 | Temp 98.6°F | Resp 18 | Ht 73.0 in | Wt 280.6 lb

## 2023-02-25 DIAGNOSIS — F32A Depression, unspecified: Secondary | ICD-10-CM

## 2023-02-25 DIAGNOSIS — Z7689 Persons encountering health services in other specified circumstances: Secondary | ICD-10-CM | POA: Diagnosis not present

## 2023-02-25 DIAGNOSIS — F419 Anxiety disorder, unspecified: Secondary | ICD-10-CM

## 2023-02-25 MED ORDER — HYDROXYZINE PAMOATE 25 MG PO CAPS
25.0000 mg | ORAL_CAPSULE | Freq: Three times a day (TID) | ORAL | 1 refills | Status: DC | PRN
  Filled 2023-02-25: qty 30, 10d supply, fill #0

## 2023-02-25 NOTE — Progress Notes (Signed)
Pt is here to est care  Pt was sent by cardiology to est with a PCP  States he is out of his BP since yesterday

## 2023-02-25 NOTE — Patient Instructions (Signed)
Thank you for choosing Primary Care at [facility] for your medical home!    Kevin [name] was seen by Rema Fendt, NP today.   Kevin [name]'s primary care provider is Ricky Stabs, NP.   For the best care possible,  you should try to see Ricky Stabs, NP whenever you come to office.   We look forward to seeing you again soon!  If you have any questions about your visit today,  please call us at [phone]  Or feel free to reach your provider via MyChart.   Keeping you healthy   Get these tests Blood pressure- Have your blood pressure checked once a year by your healthcare provider.  Normal blood pressure is 120/80. Weight- Have your body mass index (BMI) calculated to screen for obesity.  BMI is a measure of body fat based on height and weight. You can also calculate your own BMI at https://www.west-esparza.com/. Cholesterol- Have your cholesterol checked regularly starting at age 30, sooner may be necessary if you have diabetes, high blood pressure, if a family member developed heart diseases at an early age or if you smoke.  Chlamydia, HIV, and other sexual transmitted disease- Get screened each year until the age of 17 then within three months of each new sexual partner. Diabetes- Have your blood sugar checked regularly if you have high blood pressure, high cholesterol, a family history of diabetes or if you are overweight.   Get these vaccines Flu shot- Every fall. Tetanus shot- Every 10 years. Menactra- Single dose; prevents meningitis.   Take these steps Don't smoke- If you do smoke, ask your healthcare provider about quitting. For tips on how to quit, go to www.smokefree.gov or call 1-800-QUIT-NOW. Be physically active- Exercise 5 days a week for at least 30 minutes.  If you are not already physically active start slow and gradually work up to 30 minutes of moderate physical activity.  Examples of moderate activity include walking briskly, mowing the yard, dancing,  swimming bicycling, etc. Eat a healthy diet- Eat a variety of healthy foods such as fruits, vegetables, low fat milk, low fat cheese, yogurt, lean meats, poultry, fish, beans, tofu, etc.  For more information on healthy eating, go to www.thenutritionsource.org Drink alcohol in moderation- Limit alcohol intake two drinks or less a day.  Never drink and drive. Dentist- Brush and floss teeth twice daily; visit your dentis twice a year. Depression-Your emotional health is as important as your physical health.  If you're feeling down, losing interest in things you normally enjoy please talk with your healthcare provider. Gun Safety- If you keep a gun in your home, keep it unloaded and with the safety lock on.  Bullets should be stored separately. Helmet use- Always wear a helmet when riding a motorcycle, bicycle, rollerblading or skateboarding. Safe sex- If you may be exposed to a sexually transmitted infection, use a condom Seat belts- Seat bels can save your life; always wear one. Smoke/Carbon Monoxide detectors- These detectors need to be installed on the appropriate level of your home.  Replace batteries at least once a year. Skin Cancer- When out in the sun, cover up and use sunscreen SPF 15 or higher. Violence- If anyone is threatening or hurting you, please tell your healthcare provider.

## 2023-02-26 ENCOUNTER — Other Ambulatory Visit (HOSPITAL_COMMUNITY): Payer: Self-pay

## 2023-03-04 ENCOUNTER — Ambulatory Visit: Payer: 59 | Attending: Cardiovascular Disease

## 2023-03-04 DIAGNOSIS — Z79899 Other long term (current) drug therapy: Secondary | ICD-10-CM | POA: Diagnosis not present

## 2023-03-04 DIAGNOSIS — R7989 Other specified abnormal findings of blood chemistry: Secondary | ICD-10-CM

## 2023-03-04 DIAGNOSIS — R739 Hyperglycemia, unspecified: Secondary | ICD-10-CM

## 2023-03-05 LAB — COMPREHENSIVE METABOLIC PANEL
ALT: 61 IU/L — ABNORMAL HIGH (ref 0–44)
AST: 39 IU/L (ref 0–40)
Albumin/Globulin Ratio: 1.4 (ref 1.2–2.2)
Albumin: 4.3 g/dL (ref 3.8–4.9)
Alkaline Phosphatase: 117 IU/L (ref 44–121)
BUN/Creatinine Ratio: 11 (ref 9–20)
BUN: 12 mg/dL (ref 6–24)
Bilirubin Total: 0.4 mg/dL (ref 0.0–1.2)
CO2: 21 mmol/L (ref 20–29)
Calcium: 9.6 mg/dL (ref 8.7–10.2)
Chloride: 100 mmol/L (ref 96–106)
Creatinine, Ser: 1.09 mg/dL (ref 0.76–1.27)
Globulin, Total: 3 g/dL (ref 1.5–4.5)
Glucose: 242 mg/dL — ABNORMAL HIGH (ref 70–99)
Potassium: 4.4 mmol/L (ref 3.5–5.2)
Sodium: 137 mmol/L (ref 134–144)
Total Protein: 7.3 g/dL (ref 6.0–8.5)
eGFR: 80 mL/min/{1.73_m2} (ref >59)

## 2023-03-05 LAB — HEMOGLOBIN A1C
Est. average glucose Bld gHb Est-mCnc: 260 mg/dL
Hgb A1c MFr Bld: 10.7 % — ABNORMAL HIGH (ref 4.8–5.6)

## 2023-03-05 NOTE — Progress Notes (Signed)
Schedule appointment?

## 2023-03-19 NOTE — Progress Notes (Deleted)
Patient ID: Kevin [name], male    DOB: [birth year]  MRN: [id number]  CC: Annual Physical Exam  Subjective: Kevin [name] is a 57 y.o. male who presents for annual physical exam.   His concerns today include:  Anxiety depression - Hydroxyzine DM - Metformin, A1c 10.7% on 03/04/2023 Cards  Gastro - elevated liver enzymes  Patient Active Problem List   Diagnosis Date Noted   Anxiety and depression 02/25/2023   Hyperglycemia 01/28/2023   Hepatic steatosis 01/28/2023   OSA (obstructive sleep apnea) 01/08/2023   Snoring 11/12/2022   Daytime sleepiness 11/12/2022   SOB (shortness of breath) 02/05/2022   Fatigue 02/05/2022   Leg wound, right 06/22/2020   CAD (coronary artery disease) 04/15/2019   Hyperlipidemia 04/06/2019   Essential hypertension 04/06/2019   Pre-diabetes 04/06/2019   s/p NSTEMI in 03/2019 04/05/2019     Current Outpatient Medications on File Prior to Visit  Medication Sig Dispense Refill   amLODipine (NORVASC) 5 MG tablet Take 1 tablet (5 mg total) by mouth daily. 90 tablet 3   aspirin 81 MG chewable tablet Chew 1 tablet (81 mg total) by mouth daily. 90 tablet 1   hydrOXYzine (VISTARIL) 25 MG capsule Take 1 capsule (25 mg total) by mouth every 8 (eight) hours as needed. 30 capsule 1   losartan (COZAAR) 100 MG tablet Take 1 tablet (100 mg total) by mouth daily. 90 tablet 3   metFORMIN (GLUCOPHAGE) 500 MG tablet Take 1 tablet (500 mg total) by mouth 2 (two) times daily with a meal. 60 tablet 0   metoprolol tartrate (LOPRESSOR) 25 MG tablet Take 25 mg by mouth 2 (two) times daily.     rosuvastatin (CRESTOR) 40 MG tablet Take 1 tablet (40 mg total) by mouth daily. 90 tablet 3   Current Facility-Administered Medications on File Prior to Visit  Medication Dose Route Frequency Provider Last Rate Last Admin   regadenoson (LEXISCAN) injection SOLN 0.4 mg  0.4 mg Intravenous Once Branch, Alben Spittle, MD        No Known Allergies  Social History   Socioeconomic  History   Marital status: Single    Spouse name: Not on file   Number of children: Not on file   Years of education: Not on file   Highest education level: Not on file  Occupational History   Occupation: plumber's helper  Tobacco Use   Smoking status: Former    Types: Cigarettes    Quit date: 10/06/2006    Years since quitting: 16.4   Smokeless tobacco: Never  Vaping Use   Vaping Use: Never used  Substance and Sexual Activity   Alcohol use: Not Currently    Comment:     Drug use: No   Sexual activity: Not on file  Other Topics Concern   Not on file  Social History Narrative   Lives alone in [place]   Social Determinants of Health   Financial Resource Strain: Medium Risk (11/19/2022)   Overall Financial Resource Strain (CARDIA)    Difficulty of Paying Living Expenses: Somewhat hard  Food Insecurity: Food Insecurity Present (11/19/2022)   Hunger Vital Sign    Worried About Running Out of Food in the Last Year: Sometimes true    Ran Out of Food in the Last Year: Sometimes true  Transportation Needs: No Transportation Needs (11/19/2022)   PRAPARE - Administrator, Civil Service (Medical): No    Lack of Transportation (Non-Medical): No  Physical Activity: Not on file  Stress: Not on file  Social Connections: Not on file  Intimate Partner Violence: Not on file    Family History  Problem Relation Age of Onset   Hyperlipidemia Mother    Diabetes Father    Hypertension Father    Bladder Cancer Father    Hypertension Maternal Grandmother    Diabetes Maternal Grandmother    Kidney disease Maternal Grandmother    CAD Maternal Grandfather        died of MI at 64   Hypertension Maternal Grandfather    CAD Paternal Grandmother    Diabetes Paternal Grandmother     Past Surgical History:  Procedure Laterality Date   CORONARY STENT INTERVENTION N/A 04/05/2019   Procedure: CORONARY STENT INTERVENTION;  Surgeon: Tonny Bollman, MD;  [facility]: [facility] INVASIVE CV LAB;   Service: Cardiovascular;  Laterality: N/A;   LEFT HEART CATH AND CORONARY ANGIOGRAPHY N/A 04/05/2019   Procedure: LEFT HEART CATH AND CORONARY ANGIOGRAPHY;  Surgeon: Tonny Bollman, MD;  Location: [facility] INVASIVE CV LAB;  Service: Cardiovascular;  Laterality: N/A;    ROS: Review of Systems Negative except as stated above  PHYSICAL EXAM: There were no vitals taken for this visit.  Physical Exam  {male adult master:310786} {male adult master:310785}     Latest Ref Rng & Units 03/04/2023    9:36 AM 01/28/2023    4:17 PM 11/12/2022    3:11 PM  CMP  Glucose 70 - 99 mg/dL 161  096  045   BUN 6 - 24 mg/dL 12  10  11    Creatinine 0.76 - 1.27 mg/dL 4.09  8.11  9.14   Sodium 134 - 144 mmol/L 137  136  137   Potassium 3.5 - 5.2 mmol/L 4.4  4.6  4.7   Chloride 96 - 106 mmol/L 100  98  100   CO2 20 - 29 mmol/L 21  20  19    Calcium 8.7 - 10.2 mg/dL 9.6  9.8  9.8   Total Protein 6.0 - 8.5 g/dL 7.3   7.6   Total Bilirubin 0.0 - 1.2 mg/dL 0.4   0.4   Alkaline Phos 44 - 121 IU/L 117   126   AST 0 - 40 IU/L 39   32   ALT 0 - 44 IU/L 61   56    Lipid Panel     Component Value Date/Time   CHOL 116 05/09/2022 0856   TRIG 135 05/09/2022 0856   HDL 29 (L) 05/09/2022 0856   CHOLHDL 4.0 05/09/2022 0856   CHOLHDL 6.6 04/06/2019 0502   VLDL 39 04/06/2019 0502   LDLCALC 63 05/09/2022 0856    CBC    Component Value Date/Time   WBC 11.1 (H) 11/12/2022 1511   WBC 11.9 (H) 06/13/2020 0550   RBC 5.79 11/12/2022 1511   RBC 4.66 06/13/2020 0550   HGB 17.1 11/12/2022 1511   HCT 49.5 11/12/2022 1511   PLT 277 11/12/2022 1511   MCV 86 11/12/2022 1511   MCH 29.5 11/12/2022 1511   MCH 30.0 06/13/2020 0550   MCHC 34.5 11/12/2022 1511   MCHC 33.7 06/13/2020 0550   RDW 13.1 11/12/2022 1511   LYMPHSABS 2.1 06/13/2020 0550   MONOABS 1.2 (H) 06/13/2020 0550   EOSABS 0.1 06/13/2020 0550   BASOSABS 0.1 06/13/2020 0550    ASSESSMENT AND PLAN:  There are no diagnoses linked to this  encounter.   Patient was given the opportunity to ask questions.  Patient verbalized understanding of the plan and was  able to repeat key elements of the plan. Patient was given clear instructions to go to Emergency Department or return to medical center if symptoms don't improve, worsen, or new problems develop.The patient verbalized understanding.   No orders of the defined types were placed in this encounter.    Requested Prescriptions    No prescriptions requested or ordered in this encounter    No follow-ups on file.  Rema Fendt, NP

## 2023-03-20 ENCOUNTER — Encounter: Payer: Self-pay | Admitting: Family

## 2023-03-24 NOTE — Progress Notes (Unsigned)
  Patient ID: Kevin [name], male    DOB: 11/09/1965  MRN: [id number]  CC: Annual Physical Exam  Subjective: Kevin [name] is a 57 y.o. male who presents for annual physical exam.   His concerns today include:  Anxiety depression - Hydroxyzine DM - Metformin, A1c 10.7% on 03/04/2023 Cards  Gastro - elevated liver enzymes  Patient Active Problem List   Diagnosis Date Noted   Anxiety and depression 02/25/2023   Hyperglycemia 01/28/2023   Hepatic steatosis 01/28/2023   OSA (obstructive sleep apnea) 01/08/2023   Snoring 11/12/2022   Daytime sleepiness 11/12/2022   SOB (shortness of breath) 02/05/2022   Fatigue 02/05/2022   Leg wound, right 06/22/2020   CAD (coronary artery disease) 04/15/2019   Hyperlipidemia 04/06/2019   Essential hypertension 04/06/2019   Pre-diabetes 04/06/2019   s/p NSTEMI in 03/2019 04/05/2019     Current Outpatient Medications on File Prior to Visit  Medication Sig Dispense Refill   amLODipine (NORVASC) 5 MG tablet Take 1 tablet (5 mg total) by mouth daily. 90 tablet 3   aspirin 81 MG chewable tablet Chew 1 tablet (81 mg total) by mouth daily. 90 tablet 1   hydrOXYzine (VISTARIL) 25 MG capsule Take 1 capsule (25 mg total) by mouth every 8 (eight) hours as needed. 30 capsule 1   losartan (COZAAR) 100 MG tablet Take 1 tablet (100 mg total) by mouth daily. 90 tablet 3   metFORMIN (GLUCOPHAGE) 500 MG tablet Take 1 tablet (500 mg total) by mouth 2 (two) times daily with a meal. 60 tablet 0   metoprolol tartrate (LOPRESSOR) 25 MG tablet Take 25 mg by mouth 2 (two) times daily.     rosuvastatin (CRESTOR) 40 MG tablet Take 1 tablet (40 mg total) by mouth daily. 90 tablet 3   Current Facility-Administered Medications on File Prior to Visit  Medication Dose Route Frequency Provider Last Rate Last Admin   regadenoson (LEXISCAN) injection SOLN 0.4 mg  0.4 mg Intravenous Once Branch, Mary E, MD        No Known Allergies  Social History   Socioeconomic  History   Marital status: Single    Spouse name: Not on file   Number of children: Not on file   Years of education: Not on file   Highest education level: Not on file  Occupational History   Occupation: plumber's helper  Tobacco Use   Smoking status: Former    Types: Cigarettes    Quit date: 10/06/2006    Years since quitting: 16.4   Smokeless tobacco: Never  Vaping Use   Vaping Use: Never used  Substance and Sexual Activity   Alcohol use: Not Currently    Comment:     Drug use: No   Sexual activity: Not on file  Other Topics Concern   Not on file  Social History Narrative   Lives alone in [place]   Social Determinants of Health   Financial Resource Strain: Medium Risk (11/19/2022)   Overall Financial Resource Strain (CARDIA)    Difficulty of Paying Living Expenses: Somewhat hard  Food Insecurity: Food Insecurity Present (11/19/2022)   Hunger Vital Sign    Worried About Running Out of Food in the Last Year: Sometimes true    Ran Out of Food in the Last Year: Sometimes true  Transportation Needs: No Transportation Needs (11/19/2022)   PRAPARE - Transportation    Lack of Transportation (Medical): No    Lack of Transportation (Non-Medical): No  Physical Activity: Not on file    Stress: Not on file  Social Connections: Not on file  Intimate Partner Violence: Not on file    Family History  Problem Relation Age of Onset   Hyperlipidemia Mother    Diabetes Father    Hypertension Father    Bladder Cancer Father    Hypertension Maternal Grandmother    Diabetes Maternal Grandmother    Kidney disease Maternal Grandmother    CAD Maternal Grandfather        died of MI at 58   Hypertension Maternal Grandfather    CAD Paternal Grandmother    Diabetes Paternal Grandmother     Past Surgical History:  Procedure Laterality Date   CORONARY STENT INTERVENTION N/A 04/05/2019   Procedure: CORONARY STENT INTERVENTION;  Surgeon: Cooper, Michael, MD;  Location: [facility] INVASIVE CV LAB;   Service: Cardiovascular;  Laterality: N/A;   LEFT HEART CATH AND CORONARY ANGIOGRAPHY N/A 04/05/2019   Procedure: LEFT HEART CATH AND CORONARY ANGIOGRAPHY;  Surgeon: Cooper, Michael, MD;  Location: [facility] INVASIVE CV LAB;  Service: Cardiovascular;  Laterality: N/A;    ROS: Review of Systems Negative except as stated above  PHYSICAL EXAM: There were no vitals taken for this visit.  Physical Exam  {male adult master:310786} {male adult master:310785}     Latest Ref Rng & Units 03/04/2023    9:36 AM 01/28/2023    4:17 PM 11/12/2022    3:11 PM  CMP  Glucose 70 - 99 mg/dL 242  253  199   BUN 6 - 24 mg/dL 12  10  11   Creatinine 0.76 - 1.27 mg/dL 1.09  1.07  0.96   Sodium 134 - 144 mmol/L 137  136  137   Potassium 3.5 - 5.2 mmol/L 4.4  4.6  4.7   Chloride 96 - 106 mmol/L 100  98  100   CO2 20 - 29 mmol/L 21  20  19   Calcium 8.7 - 10.2 mg/dL 9.6  9.8  9.8   Total Protein 6.0 - 8.5 g/dL 7.3   7.6   Total Bilirubin 0.0 - 1.2 mg/dL 0.4   0.4   Alkaline Phos 44 - 121 IU/L 117   126   AST 0 - 40 IU/L 39   32   ALT 0 - 44 IU/L 61   56    Lipid Panel     Component Value Date/Time   CHOL 116 05/09/2022 0856   TRIG 135 05/09/2022 0856   HDL 29 (L) 05/09/2022 0856   CHOLHDL 4.0 05/09/2022 0856   CHOLHDL 6.6 04/06/2019 0502   VLDL 39 04/06/2019 0502   LDLCALC 63 05/09/2022 0856    CBC    Component Value Date/Time   WBC 11.1 (H) 11/12/2022 1511   WBC 11.9 (H) 06/13/2020 0550   RBC 5.79 11/12/2022 1511   RBC 4.66 06/13/2020 0550   HGB 17.1 11/12/2022 1511   HCT 49.5 11/12/2022 1511   PLT 277 11/12/2022 1511   MCV 86 11/12/2022 1511   MCH 29.5 11/12/2022 1511   MCH 30.0 06/13/2020 0550   MCHC 34.5 11/12/2022 1511   MCHC 33.7 06/13/2020 0550   RDW 13.1 11/12/2022 1511   LYMPHSABS 2.1 06/13/2020 0550   MONOABS 1.2 (H) 06/13/2020 0550   EOSABS 0.1 06/13/2020 0550   BASOSABS 0.1 06/13/2020 0550    ASSESSMENT AND PLAN:  There are no diagnoses linked to this  encounter.   Patient was given the opportunity to ask questions.  Patient verbalized understanding of the plan and was   able to repeat key elements of the plan. Patient was given clear instructions to go to Emergency Department or return to medical center if symptoms don't improve, worsen, or new problems develop.The patient verbalized understanding.   No orders of the defined types were placed in this encounter.    Requested Prescriptions    No prescriptions requested or ordered in this encounter    No follow-ups on file.  Vane Yapp J Amair Shrout, NP  

## 2023-03-25 ENCOUNTER — Other Ambulatory Visit (HOSPITAL_COMMUNITY): Payer: Self-pay

## 2023-03-25 ENCOUNTER — Ambulatory Visit (INDEPENDENT_AMBULATORY_CARE_PROVIDER_SITE_OTHER): Payer: 59 | Admitting: Family

## 2023-03-25 ENCOUNTER — Encounter: Payer: Self-pay | Admitting: Family

## 2023-03-25 VITALS — BP 126/84 | HR 70 | Temp 98.3°F | Ht 72.0 in | Wt 277.2 lb

## 2023-03-25 DIAGNOSIS — Z1159 Encounter for screening for other viral diseases: Secondary | ICD-10-CM | POA: Diagnosis not present

## 2023-03-25 DIAGNOSIS — Z7984 Long term (current) use of oral hypoglycemic drugs: Secondary | ICD-10-CM

## 2023-03-25 DIAGNOSIS — F419 Anxiety disorder, unspecified: Secondary | ICD-10-CM

## 2023-03-25 DIAGNOSIS — F32A Depression, unspecified: Secondary | ICD-10-CM | POA: Diagnosis not present

## 2023-03-25 DIAGNOSIS — E1165 Type 2 diabetes mellitus with hyperglycemia: Secondary | ICD-10-CM

## 2023-03-25 DIAGNOSIS — Z0000 Encounter for general adult medical examination without abnormal findings: Secondary | ICD-10-CM | POA: Diagnosis not present

## 2023-03-25 DIAGNOSIS — E119 Type 2 diabetes mellitus without complications: Secondary | ICD-10-CM

## 2023-03-25 DIAGNOSIS — Z1211 Encounter for screening for malignant neoplasm of colon: Secondary | ICD-10-CM

## 2023-03-25 LAB — POCT GLYCOSYLATED HEMOGLOBIN (HGB A1C): HbA1c, POC (controlled diabetic range): 10.1 % — AB (ref 0.0–7.0)

## 2023-03-25 MED ORDER — TRUE METRIX BLOOD GLUCOSE TEST VI STRP
1.0000 | ORAL_STRIP | Freq: Three times a day (TID) | 12 refills | Status: DC
  Filled 2023-03-25: qty 100, 25d supply, fill #0

## 2023-03-25 MED ORDER — METFORMIN HCL 1000 MG PO TABS
1000.0000 mg | ORAL_TABLET | Freq: Two times a day (BID) | ORAL | 0 refills | Status: DC
  Filled 2023-03-25: qty 60, 30d supply, fill #0

## 2023-03-25 MED ORDER — TRUEPLUS LANCETS 28G MISC
1.0000 | Freq: Three times a day (TID) | 4 refills | Status: DC
  Filled 2023-03-25: qty 100, 25d supply, fill #0

## 2023-03-25 MED ORDER — TRUE METRIX METER W/DEVICE KIT
PACK | 0 refills | Status: DC
  Filled 2023-03-25: qty 1, fill #0

## 2023-03-25 MED ORDER — TRUE METRIX METER W/DEVICE KIT
1.0000 | PACK | Freq: Three times a day (TID) | 0 refills | Status: DC
  Filled 2023-03-25: qty 1, 1d supply, fill #0
  Filled 2023-03-25: qty 1, 25d supply, fill #0

## 2023-03-25 MED ORDER — HYDROXYZINE PAMOATE 25 MG PO CAPS
25.0000 mg | ORAL_CAPSULE | Freq: Three times a day (TID) | ORAL | 1 refills | Status: DC | PRN
  Filled 2023-03-25: qty 30, 10d supply, fill #0

## 2023-03-25 NOTE — Progress Notes (Signed)
PT states previous Dr says he's diabetic.

## 2023-03-25 NOTE — Patient Instructions (Signed)

## 2023-03-26 ENCOUNTER — Other Ambulatory Visit (HOSPITAL_COMMUNITY): Payer: Self-pay

## 2023-03-26 ENCOUNTER — Other Ambulatory Visit: Payer: Self-pay | Admitting: Physician Assistant

## 2023-03-26 LAB — MICROALBUMIN / CREATININE URINE RATIO
Creatinine, Urine: 202.7 mg/dL
Microalb/Creat Ratio: 29 mg/g creat (ref 0–29)
Microalbumin, Urine: 59.3 ug/mL

## 2023-03-26 LAB — HEPATITIS C ANTIBODY: Hep C Virus Ab: NONREACTIVE

## 2023-03-26 MED ORDER — METOPROLOL TARTRATE 25 MG PO TABS
25.0000 mg | ORAL_TABLET | Freq: Two times a day (BID) | ORAL | 2 refills | Status: DC
  Filled 2023-03-26: qty 60, 30d supply, fill #0
  Filled 2023-05-29: qty 60, 30d supply, fill #1
  Filled 2023-08-03: qty 60, 30d supply, fill #2
  Filled 2023-10-19: qty 60, 30d supply, fill #3
  Filled 2024-01-07: qty 60, 30d supply, fill #4
  Filled 2024-03-11: qty 60, 30d supply, fill #5

## 2023-03-27 ENCOUNTER — Other Ambulatory Visit (HOSPITAL_COMMUNITY): Payer: Self-pay

## 2023-03-30 ENCOUNTER — Other Ambulatory Visit (HOSPITAL_COMMUNITY): Payer: Self-pay

## 2023-04-08 ENCOUNTER — Ambulatory Visit: Payer: 59 | Admitting: Internal Medicine

## 2023-04-22 ENCOUNTER — Other Ambulatory Visit (HOSPITAL_COMMUNITY): Payer: Self-pay

## 2023-04-22 ENCOUNTER — Ambulatory Visit: Payer: 59 | Attending: Physician Assistant | Admitting: Physician Assistant

## 2023-04-22 ENCOUNTER — Encounter: Payer: Self-pay | Admitting: Physician Assistant

## 2023-04-22 VITALS — BP 130/90 | HR 72 | Ht 72.0 in | Wt 279.8 lb

## 2023-04-22 DIAGNOSIS — E1159 Type 2 diabetes mellitus with other circulatory complications: Secondary | ICD-10-CM | POA: Diagnosis not present

## 2023-04-22 DIAGNOSIS — K760 Fatty (change of) liver, not elsewhere classified: Secondary | ICD-10-CM | POA: Diagnosis not present

## 2023-04-22 DIAGNOSIS — I10 Essential (primary) hypertension: Secondary | ICD-10-CM

## 2023-04-22 DIAGNOSIS — E782 Mixed hyperlipidemia: Secondary | ICD-10-CM | POA: Diagnosis not present

## 2023-04-22 DIAGNOSIS — I2510 Atherosclerotic heart disease of native coronary artery without angina pectoris: Secondary | ICD-10-CM | POA: Diagnosis not present

## 2023-04-22 DIAGNOSIS — G4733 Obstructive sleep apnea (adult) (pediatric): Secondary | ICD-10-CM

## 2023-04-22 DIAGNOSIS — Z7984 Long term (current) use of oral hypoglycemic drugs: Secondary | ICD-10-CM | POA: Diagnosis not present

## 2023-04-22 MED ORDER — LOSARTAN POTASSIUM-HCTZ 100-25 MG PO TABS
1.0000 | ORAL_TABLET | Freq: Every day | ORAL | 3 refills | Status: DC
  Filled 2023-04-22: qty 30, 30d supply, fill #0
  Filled 2023-06-09: qty 30, 30d supply, fill #1
  Filled 2023-08-03: qty 30, 30d supply, fill #2
  Filled 2023-10-19: qty 30, 30d supply, fill #3
  Filled 2024-01-01: qty 30, 30d supply, fill #4
  Filled 2024-03-11: qty 30, 30d supply, fill #5

## 2023-04-22 NOTE — Patient Instructions (Signed)
Medication Instructions:  Your physician has recommended you make the following change in your medication:  STOP Losartan  START Losartan-hydrochlorothiazide 100/12.5 taking 1 daily  *If you need a refill on your cardiac medications before your next appointment, please call your pharmacy*   Lab Work: 05/08/23 COME TO THE LAB, AFTER 7:15 AM FOR:  BMET  If you have labs (blood work) drawn today and your tests are completely normal, you will receive your results only by: MyChart Message (if you have MyChart) OR A paper copy in the mail If you have any lab test that is abnormal or we need to change your treatment, we will call you to review the results.   Testing/Procedures: None ordered   Follow-Up: At [facility], you and your health needs are our priority.  As part of our continuing mission to provide you with exceptional heart care, we have created designated Provider Care Teams.  These Care Teams include your primary Cardiologist (physician) and Advanced Practice Providers (APPs -  Physician Assistants and Nurse Practitioners) who all work together to provide you with the care you need, when you need it.  We recommend signing up for the patient portal called "MyChart".  Sign up information is provided on this After Visit Summary.  MyChart is used to connect with patients for Virtual Visits (Telemedicine).  Patients are able to view lab/test results, encounter notes, upcoming appointments, etc.  Non-urgent messages can be sent to your provider as well.   To learn more about what you can do with MyChart, go to [email or web address].    Your next appointment:   6 month(s)  Provider:   Tonny Bollman, MD  or Tereso Newcomer, PA-C         Other Instructions

## 2023-04-22 NOTE — Progress Notes (Signed)
Cardiology Office Note:    Date:  04/22/2023  ID:  Kevin [name], DOB [birth year], MRN [id number] PCP: Rema Fendt, NP  [facility] Providers Cardiologist:  Tonny Bollman, MD       Patient Profile:      Coronary artery disease  S/p NSTEMI 03/2019 >> PCI: 3 x 12 mm DES to pLAD LHC 04/05/2019: LAD ostial 95, mid 30, distal 50; OM1 50, OM2 70 (small); RCA proximal 50 Myoview 11/17/2022: EF 67, normal perfusion, low risk TTE 02/25/22: EF 60-65, no RWMA, normal RVSF, trivial MR, RAP 3 TTE 12/09/2022: EF 60-65, no RWMA, normal RVSF, mild LAE, RAP 3, no valvular disease Hyperlipidemia  Hypertension  Diabetes mellitus  Sleep apnea  Fatty Liver            Discussed the use of AI scribe software for clinical note transcription with the patient, who gave verbal consent to proceed.  History of Present Illness   A 57 year old patient with a history of CAD, hypertension, hyperlipidemia, sleep apnea, fatty liver, and diabetes mellitus presents for a three-month follow-up. Despite the addition of amlodipine 5mg  daily to his regimen, his blood pressure remains uncontrolled. The patient reports not hearing back about the setup for his CPAP machine for sleep apnea and is unsure about the date of his appointment with a gastroenterologist for his fatty liver. The patient also mentions occasional shortness of breath when walking from his house to the mailbox and back. This seems to have improved some. He has a wound on his right leg from a fall off a tin roof three to four years ago, which has resulted in persistent swelling due to nerve and artery damage.     ROS: See HPI    Studies Reviewed:       Risk Assessment/Calculations:     HYPERTENSION CONTROL Vitals:   04/22/23 1509 04/22/23 1554  BP: (!) 142/82 (!) 130/90    The patient's blood pressure is elevated above target today.  In order to address the patient's elevated BP: A new medication was prescribed today.      STOP-Bang  Score:  7      Physical Exam:   VS:  BP (!) 130/90   Pulse 72   Ht 6' (1.829 m)   Wt 279 lb 12.8 oz (126.9 kg)   SpO2 97%   BMI 37.95 kg/m    Wt Readings from Last 3 Encounters:  04/22/23 279 lb 12.8 oz (126.9 kg)  03/25/23 277 lb 3.2 oz (125.7 kg)  02/25/23 280 lb 9.6 oz (127.3 kg)    Physical Exam   GENERAL: Well-nourished, well-developed male, no acute stress. NECK: No jugular venous distension. CHEST: Lungs clear to auscultation bilaterally. CARDIOVASCULAR: Cardiac normal, S1, S2, regular rate and rhythm. EXTREMITIES: Right leg with 1+ edema. There is a wound with scab formation mid tibia. Right leg with trace edema. SKIN: Skin warm and dry.         Assessment and Plan     Coronary artery disease:  He is doing well w/o chest pain to suggest angina. Continue ASA 81 mg daily, Rosuvastatin 40 mg daily.  Hypertension:  Uncontrolled despite current regimen of Amlodipine 5mg  daily. Discussed lifestyle modifications including diet, exercise, and weight loss. -Discontinue Losartan and start Losartan/HCTZ 100/12.5mg  daily. -Check basic metabolic panel in 2 weeks.  Sleep Apnea:  Pending setup for CPAP therapy. Patient reports no recent communication regarding this. -I will send a message to the CMA to follow up on  CPAP setup.  Fatty Liver:  Elevated liver enzymes and evidence of fatty deposits on [place]. Patient has an upcoming appointment with gastroenterology but unsure of date. -Encourage patient to confirm and attend gastroenterology appointment.  Diabetes Mellitus:  Currently managed by primary care with Metformin. Discussed potential benefits of newer medications such as GLP1 agonists for weight loss and cardiovascular protection. -Consider GLP1 agonist if A1c not controlled with current regimen.        Dispo:  Return in about 6 months (around 10/23/2023) for Routine Follow Up, w/ Dr. Excell Seltzer, or Tereso Newcomer, PA-C.  Signed, Tereso Newcomer, PA-C

## 2023-04-23 ENCOUNTER — Other Ambulatory Visit (HOSPITAL_COMMUNITY): Payer: Self-pay

## 2023-04-28 NOTE — Telephone Encounter (Signed)
At patients request I have emailed the patient that another patient assistance form has been mailed out to him. Patient states he never received the first form I sent to him because something is going on with his mail carrier.

## 2023-04-29 ENCOUNTER — Ambulatory Visit: Payer: 59 | Admitting: Family

## 2023-05-08 ENCOUNTER — Ambulatory Visit: Payer: 59 | Attending: Cardiovascular Disease

## 2023-05-08 DIAGNOSIS — I2510 Atherosclerotic heart disease of native coronary artery without angina pectoris: Secondary | ICD-10-CM

## 2023-05-08 DIAGNOSIS — I10 Essential (primary) hypertension: Secondary | ICD-10-CM | POA: Diagnosis not present

## 2023-05-08 DIAGNOSIS — E782 Mixed hyperlipidemia: Secondary | ICD-10-CM | POA: Diagnosis not present

## 2023-05-18 ENCOUNTER — Ambulatory Visit (INDEPENDENT_AMBULATORY_CARE_PROVIDER_SITE_OTHER): Payer: 59 | Admitting: Family

## 2023-05-18 ENCOUNTER — Encounter: Payer: Self-pay | Admitting: Family

## 2023-05-18 ENCOUNTER — Other Ambulatory Visit (HOSPITAL_COMMUNITY): Payer: Self-pay

## 2023-05-18 VITALS — BP 128/64 | HR 68 | Temp 97.8°F | Ht 72.0 in | Wt 225.6 lb

## 2023-05-18 DIAGNOSIS — Z1211 Encounter for screening for malignant neoplasm of colon: Secondary | ICD-10-CM

## 2023-05-18 DIAGNOSIS — F32A Depression, unspecified: Secondary | ICD-10-CM | POA: Diagnosis not present

## 2023-05-18 DIAGNOSIS — Z7984 Long term (current) use of oral hypoglycemic drugs: Secondary | ICD-10-CM | POA: Diagnosis not present

## 2023-05-18 DIAGNOSIS — E119 Type 2 diabetes mellitus without complications: Secondary | ICD-10-CM

## 2023-05-18 DIAGNOSIS — Z13228 Encounter for screening for other metabolic disorders: Secondary | ICD-10-CM | POA: Diagnosis not present

## 2023-05-18 DIAGNOSIS — Z130 Encounter for screening for diseases of the blood and blood-forming organs and certain disorders involving the immune mechanism: Secondary | ICD-10-CM

## 2023-05-18 DIAGNOSIS — Z0000 Encounter for general adult medical examination without abnormal findings: Secondary | ICD-10-CM

## 2023-05-18 DIAGNOSIS — Z794 Long term (current) use of insulin: Secondary | ICD-10-CM | POA: Diagnosis not present

## 2023-05-18 DIAGNOSIS — E1165 Type 2 diabetes mellitus with hyperglycemia: Secondary | ICD-10-CM

## 2023-05-18 DIAGNOSIS — F419 Anxiety disorder, unspecified: Secondary | ICD-10-CM | POA: Diagnosis not present

## 2023-05-18 DIAGNOSIS — Z0001 Encounter for general adult medical examination with abnormal findings: Secondary | ICD-10-CM | POA: Diagnosis not present

## 2023-05-18 DIAGNOSIS — Z0100 Encounter for examination of eyes and vision without abnormal findings: Secondary | ICD-10-CM

## 2023-05-18 MED ORDER — HYDROXYZINE PAMOATE 25 MG PO CAPS
25.0000 mg | ORAL_CAPSULE | Freq: Three times a day (TID) | ORAL | 1 refills | Status: DC | PRN
Start: 2023-05-18 — End: 2023-11-02
  Filled 2023-05-18: qty 30, 10d supply, fill #0

## 2023-05-18 MED ORDER — TRUEPLUS LANCETS 28G MISC
1.0000 | Freq: Three times a day (TID) | 4 refills | Status: DC
  Filled 2023-05-18: qty 100, 25d supply, fill #0

## 2023-05-18 MED ORDER — TRUE METRIX METER W/DEVICE KIT
1.0000 | PACK | Freq: Three times a day (TID) | 0 refills | Status: DC
Start: 2023-05-18 — End: 2023-11-02
  Filled 2023-05-18: qty 1, 30d supply, fill #0

## 2023-05-18 MED ORDER — METFORMIN HCL 1000 MG PO TABS
1000.0000 mg | ORAL_TABLET | Freq: Two times a day (BID) | ORAL | 0 refills | Status: DC
Start: 2023-05-18 — End: 2023-12-31
  Filled 2023-05-18: qty 60, 30d supply, fill #0
  Filled 2023-08-03: qty 60, 30d supply, fill #1
  Filled 2023-10-19: qty 60, 30d supply, fill #2

## 2023-05-18 MED ORDER — TRUE METRIX BLOOD GLUCOSE TEST VI STRP
1.0000 | ORAL_STRIP | Freq: Three times a day (TID) | 12 refills | Status: DC
Start: 2023-05-18 — End: 2023-11-02
  Filled 2023-05-18: qty 100, 25d supply, fill #0

## 2023-05-18 NOTE — Patient Instructions (Signed)

## 2023-05-18 NOTE — Progress Notes (Signed)
Patient ID: Kevin [name], male    DOB: Feb 24, 1966  MRN: [id number]  CC: Annual Physical Exam   Subjective: Kevin [name] is a 57 y.o. male who presents for annual physical exam.   His concerns today include:  - Doing well on Metformin, no issues/concerns. States he is not checking blood sugars outside of office because he didn't know he was supposed to. States when Ophthalmology referral called he did not schedule an diabetic eye exam appointment because he didn't know he was supposed to. He denies red flag symptoms associated with diabetes.  - Doing well on Hydroxyzine, no issues/concerns. He denies thoughts of self-harm, suicidal ideations, homicidal ideations. - Established with Cardiology for chronic conditions management.  - No further issues/concerns for discussion today.    Patient Active Problem List   Diagnosis Date Noted   Anxiety and depression 02/25/2023   Hyperglycemia 01/28/2023   Hepatic steatosis 01/28/2023   OSA (obstructive sleep apnea) 01/08/2023   Snoring 11/12/2022   Daytime sleepiness 11/12/2022   SOB (shortness of breath) 02/05/2022   Fatigue 02/05/2022   Leg wound, right 06/22/2020   CAD (coronary artery disease) 04/15/2019   Hyperlipidemia 04/06/2019   Essential hypertension 04/06/2019   DM (diabetes mellitus) (HCC) 04/06/2019   s/p NSTEMI in 03/2019 04/05/2019     Current Outpatient Medications on File Prior to Visit  Medication Sig Dispense Refill   amLODipine (NORVASC) 5 MG tablet Take 1 tablet (5 mg total) by mouth daily. 90 tablet 3   aspirin 81 MG chewable tablet Chew 1 tablet (81 mg total) by mouth daily. 90 tablet 1   losartan-hydrochlorothiazide (HYZAAR) 100-25 MG tablet Take 1 tablet by mouth daily. 90 tablet 3   metoprolol tartrate (LOPRESSOR) 25 MG tablet Take 1 tablet (25 mg total) by mouth 2 (two) times daily. 180 tablet 2   rosuvastatin (CRESTOR) 40 MG tablet Take 1 tablet (40 mg total) by mouth daily. 90 tablet 3   Current  Facility-Administered Medications on File Prior to Visit  Medication Dose Route Frequency Provider Last Rate Last Admin   regadenoson (LEXISCAN) injection SOLN 0.4 mg  0.4 mg Intravenous Once Branch, Alben Spittle, MD        No Known Allergies  Social History   Socioeconomic History   Marital status: Single    Spouse name: Not on file   Number of children: Not on file   Years of education: Not on file   Highest education level: Not on file  Occupational History   Occupation: plumber's helper  Tobacco Use   Smoking status: Former    Current packs/day: 0.00    Types: Cigarettes    Quit date: 10/06/2006    Years since quitting: 16.6   Smokeless tobacco: Never  Vaping Use   Vaping status: Never Used  Substance and Sexual Activity   Alcohol use: Not Currently    Comment:     Drug use: No   Sexual activity: Not on file  Other Topics Concern   Not on file  Social History Narrative   Lives alone in [place]   Social Determinants of Health   Financial Resource Strain: Medium Risk (11/19/2022)   Overall Financial Resource Strain (CARDIA)    Difficulty of Paying Living Expenses: Somewhat hard  Food Insecurity: Food Insecurity Present (11/19/2022)   Hunger Vital Sign    Worried About Running Out of Food in the Last Year: Sometimes true    Ran Out of Food in the Last Year: Sometimes true  Transportation Needs: No Transportation Needs (11/19/2022)   PRAPARE - Administrator, Civil Service (Medical): No    Lack of Transportation (Non-Medical): No  Physical Activity: Not on file  Stress: Not on file  Social Connections: Not on file  Intimate Partner Violence: Not on file    Family History  Problem Relation Age of Onset   Hyperlipidemia Mother    Diabetes Father    Hypertension Father    Bladder Cancer Father    Hypertension Maternal Grandmother    Diabetes Maternal Grandmother    Kidney disease Maternal Grandmother    CAD Maternal Grandfather        died of MI at 74    Hypertension Maternal Grandfather    CAD Paternal Grandmother    Diabetes Paternal Grandmother     Past Surgical History:  Procedure Laterality Date   CORONARY STENT INTERVENTION N/A 04/05/2019   Procedure: CORONARY STENT INTERVENTION;  Surgeon: Tonny Bollman, MD;  Location: [facility] INVASIVE CV LAB;  Service: Cardiovascular;  Laterality: N/A;   LEFT HEART CATH AND CORONARY ANGIOGRAPHY N/A 04/05/2019   Procedure: LEFT HEART CATH AND CORONARY ANGIOGRAPHY;  Surgeon: Tonny Bollman, MD;  Location: [facility] INVASIVE CV LAB;  Service: Cardiovascular;  Laterality: N/A;    ROS: Review of Systems Negative except as stated above  PHYSICAL EXAM: BP 128/64   Pulse 68   Temp 97.8 F (36.6 C) (Oral)   Ht 6' (1.829 m)   Wt 225 lb 9.6 oz (102.3 kg)   SpO2 94%   BMI 30.60 kg/m   Physical Exam HENT:     Head: Normocephalic and atraumatic.     Right Ear: Tympanic membrane, ear canal and external ear normal.     Left Ear: Tympanic membrane, ear canal and external ear normal.     Nose: Nose normal.     Mouth/Throat:     Mouth: Mucous membranes are moist.     Pharynx: Oropharynx is clear.  Eyes:     Extraocular Movements: Extraocular movements intact.     Conjunctiva/sclera: Conjunctivae normal.     Pupils: Pupils are equal, round, and reactive to light.  Cardiovascular:     Rate and Rhythm: Normal rate and regular rhythm.     Pulses: Normal pulses.     Heart sounds: Normal heart sounds.  Pulmonary:     Effort: Pulmonary effort is normal.     Breath sounds: Normal breath sounds.  Abdominal:     General: Bowel sounds are normal.     Palpations: Abdomen is soft.  Genitourinary:    Comments: Patient declined.  Musculoskeletal:        General: Normal range of motion.     Right shoulder: Normal.     Left shoulder: Normal.     Right upper arm: Normal.     Left upper arm: Normal.     Right elbow: Normal.     Left elbow: Normal.     Right forearm: Normal.     Left forearm: Normal.     Right  wrist: Normal.     Left wrist: Normal.     Right hand: Normal.     Left hand: Normal.     Cervical back: Normal, normal range of motion and neck supple.     Thoracic back: Normal.     Lumbar back: Normal.     Right hip: Normal.     Left hip: Normal.     Right upper leg: Normal.  Left upper leg: Normal.     Right knee: Normal.     Left knee: Normal.     Right lower leg: Normal.     Left lower leg: Normal.     Right ankle: Normal.     Left ankle: Normal.     Right foot: Normal.     Left foot: Normal.  Skin:    General: Skin is warm and dry.  Neurological:     General: No focal deficit present.     Mental Status: He is alert and oriented to person, place, and time.  Psychiatric:        Mood and Affect: Mood normal.        Behavior: Behavior normal.     ASSESSMENT AND PLAN: 1. Annual physical exam - Counseled on 150 minutes of exercise per week as tolerated, healthy eating (including decreased daily intake of saturated fats, cholesterol, added sugars, sodium), STI prevention, and routine healthcare maintenance.  2. Screening for metabolic disorder - Routine screening.  - ALT  3. Screening for deficiency anemia - Routine screening.  - CBC  4. Colon cancer screening - Routine screening.  - Ambulatory referral to Gastroenterology  5. Type 2 diabetes mellitus with hyperglycemia, without long-term current use of insulin (HCC) - Continue Metformin as prescribed.  - Routine screening.  - Discussed the importance of healthy eating habits, low-carbohydrate diet, low-sugar diet, regular aerobic exercise (at least 150 minutes a week as tolerated) and medication compliance to achieve or maintain control of diabetes. - To achieve an A1C goal of less than or equal to 7.0 percent, a fasting blood sugar of 80 to 130 mg/dL and a postprandial glucose (90 to 120 minutes after a meal) less than 180 mg/dL. In the event of sugars less than 60 mg/dl or greater than 841 mg/dl please notify  the clinic ASAP. It is recommended that you undergo annual eye exams and annual foot exams.  - Hemoglobin A1c - Follow-up with primary provider as scheduled.  - metFORMIN (GLUCOPHAGE) 1000 MG tablet; Take 1 tablet (1,000 mg total) by mouth 2 (two) times daily with a meal.  Dispense: 180 tablet; Refill: 0 - Blood Glucose Monitoring Suppl (TRUE METRIX METER) w/Device KIT; Use as directed 4 (four) times daily -  before meals and at bedtime.  Dispense: 1 kit; Refill: 0 - glucose blood (TRUE METRIX BLOOD GLUCOSE TEST) test strip; Test 4 (four) times daily -  before meals and at bedtime. Use as instructed  Dispense: 100 each; Refill: 12 - TRUEplus Lancets 28G MISC; Test 4 (four) times daily -  before meals and at bedtime. Use as directed  Dispense: 100 each; Refill: 4  6. Diabetic eye exam [facility]) - Referral to Ophthalmology for further evaluation/management.  - Ambulatory referral to Ophthalmology  7. Encounter for diabetic foot exam [facility]) - Referral to Podiatry for further evaluation/management. - Ambulatory referral to Podiatry  8. Anxiety and depression - Patient denies thoughts of self-harm, suicidal ideations, homicidal ideations. - Continue Hydroxyzine as prescribed. Counseled on medication adherence/adverse effects.  - Follow-up with primary provider in 3 months or sooner if needed.  - hydrOXYzine (VISTARIL) 25 MG capsule; Take 1 capsule (25 mg total) by mouth every 8 (eight) hours as needed for anxiety.  Dispense: 30 capsule; Refill: 1    Patient was given the opportunity to ask questions.  Patient verbalized understanding of the plan and was able to repeat key elements of the plan. Patient was given clear instructions to go to  Emergency Department or return to medical center if symptoms don't improve, worsen, or new problems develop.The patient verbalized understanding.   Orders Placed This Encounter  Procedures   ALT   Hemoglobin A1c   CBC   Ambulatory referral to Podiatry    Ambulatory referral to Gastroenterology   Ambulatory referral to Ophthalmology     Requested Prescriptions   Signed Prescriptions Disp Refills   metFORMIN (GLUCOPHAGE) 1000 MG tablet 180 tablet 0    Sig: Take 1 tablet (1,000 mg total) by mouth 2 (two) times daily with a meal.   hydrOXYzine (VISTARIL) 25 MG capsule 30 capsule 1    Sig: Take 1 capsule (25 mg total) by mouth every 8 (eight) hours as needed for anxiety.   Blood Glucose Monitoring Suppl (TRUE METRIX METER) w/Device KIT 1 kit 0    Sig: Use as directed 4 (four) times daily -  before meals and at bedtime.   glucose blood (TRUE METRIX BLOOD GLUCOSE TEST) test strip 100 each 12    Sig: Test 4 (four) times daily -  before meals and at bedtime. Use as instructed   TRUEplus Lancets 28G MISC 100 each 4    Sig: Test 4 (four) times daily -  before meals and at bedtime. Use as directed    Return in about 1 year (around 05/17/2024) for Physical per patient preference.  Rema Fendt, NP

## 2023-05-18 NOTE — Progress Notes (Signed)
Pt wants to talk about diabetes.

## 2023-05-19 ENCOUNTER — Other Ambulatory Visit: Payer: Self-pay | Admitting: Family

## 2023-05-19 ENCOUNTER — Other Ambulatory Visit (HOSPITAL_COMMUNITY): Payer: Self-pay

## 2023-05-19 DIAGNOSIS — E1165 Type 2 diabetes mellitus with hyperglycemia: Secondary | ICD-10-CM

## 2023-05-19 DIAGNOSIS — R748 Abnormal levels of other serum enzymes: Secondary | ICD-10-CM

## 2023-05-19 DIAGNOSIS — D72829 Elevated white blood cell count, unspecified: Secondary | ICD-10-CM

## 2023-05-19 MED ORDER — PEN NEEDLES 31G X 8 MM MISC
1.0000 | Freq: Every day | 6 refills | Status: DC
Start: 2023-05-19 — End: 2023-11-02
  Filled 2023-05-19: qty 100, 30d supply, fill #0

## 2023-05-19 MED ORDER — BASAGLAR KWIKPEN 100 UNIT/ML ~~LOC~~ SOPN
10.0000 [IU] | PEN_INJECTOR | Freq: Every day | SUBCUTANEOUS | 0 refills | Status: DC
Start: 2023-05-19 — End: 2023-11-02
  Filled 2023-05-19: qty 3, 30d supply, fill #0

## 2023-05-29 ENCOUNTER — Other Ambulatory Visit (HOSPITAL_COMMUNITY): Payer: Self-pay

## 2023-05-29 ENCOUNTER — Telehealth: Payer: Self-pay

## 2023-05-29 NOTE — Telephone Encounter (Signed)
Pt came into office as walkin. Triage called to front. Patient wanted to relay 2 messages to [facility], PA-C. He states his PCP has ordered insulin injections for him to start and he wanted Scott's approval before he started those injections. He also dropped off a note stating "SpO2 drops to 92% with brief activity (90 seconds)" saying that his PCP wanted him to relay that to [place] as well. Told Pt I would relay the message to [facility], PA-C.

## 2023-06-01 ENCOUNTER — Ambulatory Visit (INDEPENDENT_AMBULATORY_CARE_PROVIDER_SITE_OTHER): Payer: 59 | Admitting: Podiatry

## 2023-06-01 ENCOUNTER — Encounter: Payer: Self-pay | Admitting: Podiatry

## 2023-06-01 ENCOUNTER — Other Ambulatory Visit (HOSPITAL_COMMUNITY): Payer: Self-pay

## 2023-06-01 DIAGNOSIS — E1159 Type 2 diabetes mellitus with other circulatory complications: Secondary | ICD-10-CM

## 2023-06-01 NOTE — Progress Notes (Signed)
  Subjective:  Patient ID: Kevin [name], male    DOB: [birth year],   MRN: [id number]  No chief complaint on file.   57 y.o. male presents for diabetic foot check. Denies  burning and tingling in their feet. Patient is diabetic and last A1c was  Lab Results  Component Value Date   HGBA1C 10.5 (H) 05/18/2023   .   PCP:  Rema Fendt, NP    . Denies any other pedal complaints. Denies n/v/f/c.   Past Medical History:  Diagnosis Date   Coronary artery disease    a. 04/05/19 NSTEMI, DES to pLAD, // Myoview 11/17/22: EF 67, normal perfusion; low risk   Hyperlipidemia LDL goal <70    Hypertension    NSTEMI (non-ST elevated myocardial infarction) (HCC) 04/05/2019   OSA (obstructive sleep apnea)    home sleep study on 11/18/2022 showing moderate obstructive sleep apnea with an AHI of 16.6/h and no central events.   Pre-diabetes     Objective:  Physical Exam: Vascular: DP/PT pulses 2/4 bilateral. CFT <3 seconds. Absent hair growth on digits. No edema noted.  Skin. No lacerations or abrasions bilateral feet. Nails 1-5 bilateral  are normal in appearance. .  Musculoskeletal: MMT 5/5 bilateral lower extremities in DF, PF, Inversion and Eversion. Deceased ROM in DF of ankle joint.  Neurological: Sensation intact to light touch. Protective sensation intact bilateral.    Assessment:   1. Type 2 diabetes mellitus with other circulatory complication, without long-term current use of insulin (HCC)      Plan:  Patient was evaluated and treated and all questions answered. -Discussed and educated patient on diabetic foot care, especially with  regards to the vascular, neurological and musculoskeletal systems.  -Stressed the importance of good glycemic control and the detriment of not  controlling glucose levels in relation to the foot. -Discussed supportive shoes at all times and checking feet regularly.   -Answered all patient questions -Patient to return  in  1 year for DM foot check.   -Patient advised to call the office if any problems or questions arise in the meantime.   Louann Sjogren, DPM

## 2023-06-01 NOTE — Telephone Encounter (Signed)
Ok on the insulin. His A1c is way to high. Hopefully this will bring it down.   I do not see any documented low O2 sats in his chart. Most recent visit with PCP was 94%. Recent cardiac workup has been normal (normal EF on echocardiogram in 12/2022, normal stress test in 11/2022, normal BNP earlier this year). He is to be set up for CPAP for OSA. Not sure where he is at in this process. If he is still having issues with shortness of breath, please refer to pulmonology.  Tereso Newcomer, PA-C    06/01/2023 10:55 AM

## 2023-06-01 NOTE — Telephone Encounter (Signed)
Call placed to pt.  He has been made aware of [employer] recommendations.  He asked if we seen the "yellow" paper he dropped off with the RN?  I advised him we haven't but will get it from her.

## 2023-06-09 ENCOUNTER — Other Ambulatory Visit (HOSPITAL_COMMUNITY): Payer: Self-pay

## 2023-06-10 ENCOUNTER — Telehealth: Payer: Self-pay | Admitting: Hematology and Oncology

## 2023-06-10 NOTE — Telephone Encounter (Signed)
Scheduled appointment per referral. Patient is aware of appointment date and time. Patient is aware to arrive 15 mins prior to appointment time and to bring updated insurance cards. Patient is aware of location.   

## 2023-06-12 NOTE — Progress Notes (Unsigned)
[facility]   Telephone:([phone]) [phone] Fax:([phone]) [phone]   Clinic New Consult Note   Patient Care Team: Rema Fendt, NP as PCP - General (Nurse Practitioner) Tonny Bollman, MD as PCP - Cardiology (Cardiology) 06/13/2023  CHIEF COMPLAINTS/PURPOSE OF CONSULTATION:  Leukocytosis   REFERRING PHYSICIAN: Ricky Stabs, NP  HISTORY OF PRESENTING ILLNESS:  Kevin [name] 57 y.o. male is here because of leukocytosis.  He was referred by his PCP.  He presented clinic by himself.  Patient did not have routine health or dental care until he was admitted to hospital and diagnosed with heart attack in 2020.  He is stable BC was slightly elevated in 2020 around 11.6-13.3, with predominant neutrophil.  His WBC was normal in May 2023.  On his routine lab in February 2024, WBC was slightly elevated at 11.1, which is slightly further increased to 13.0 on May 18, 2023.  WBC differential was not obtained.  His hemoglobin and platelet has been normal.  He has been diagnosed with hypertension and diabetes in the past few years, on medication.  He has not had a dental care for many years.  He overall feels well, denies any significant pain or other concerns, appetite is fair, no recent weight loss.  No fever or night sweats.  He lives with a roommate.  He used to smoke heavily and drink alcohol moderately, which he quit about 3 to 4 years ago.  He denies any illicit drug use.   MEDICAL HISTORY:  Past Medical History:  Diagnosis Date   Coronary artery disease    a. 04/05/19 NSTEMI, DES to pLAD, // Myoview 11/17/22: EF 67, normal perfusion; low risk   Hyperlipidemia LDL goal <70    Hypertension    NSTEMI (non-ST elevated myocardial infarction) (HCC) 04/05/2019   OSA (obstructive sleep apnea)    home sleep study on 11/18/2022 showing moderate obstructive sleep apnea with an AHI of 16.6/h and no central events.   Pre-diabetes     SURGICAL HISTORY: Past Surgical History:  Procedure  Laterality Date   CORONARY STENT INTERVENTION N/A 04/05/2019   Procedure: CORONARY STENT INTERVENTION;  Surgeon: Tonny Bollman, MD;  Location: [facility] INVASIVE CV LAB;  Service: Cardiovascular;  Laterality: N/A;   LEFT HEART CATH AND CORONARY ANGIOGRAPHY N/A 04/05/2019   Procedure: LEFT HEART CATH AND CORONARY ANGIOGRAPHY;  Surgeon: Tonny Bollman, MD;  Location: [facility] INVASIVE CV LAB;  Service: Cardiovascular;  Laterality: N/A;    SOCIAL HISTORY: Social History   Socioeconomic History   Marital status: Single    Spouse name: Not on file   Number of children: 2   Years of education: Not on file   Highest education level: Not on file  Occupational History   Occupation: plumber's helper  Tobacco Use   Smoking status: Former    Current packs/day: 0.00    Types: Cigarettes    Quit date: 10/06/2006    Years since quitting: 16.6   Smokeless tobacco: Never  Vaping Use   Vaping status: Never Used  Substance and Sexual Activity   Alcohol use: Not Currently    Comment: He used to drink alcohol moderately, quit in 2020   Drug use: No   Sexual activity: Not on file  Other Topics Concern   Not on file  Social History Narrative   Lives alone in [place]   Social Determinants of Health   Financial Resource Strain: Medium Risk (11/19/2022)   Overall Financial Resource Strain (CARDIA)    Difficulty of Paying  Living Expenses: Somewhat hard  Food Insecurity: Food Insecurity Present (11/19/2022)   Hunger Vital Sign    Worried About Running Out of Food in the Last Year: Sometimes true    Ran Out of Food in the Last Year: Sometimes true  Transportation Needs: No Transportation Needs (11/19/2022)   PRAPARE - Administrator, Civil Service (Medical): No    Lack of Transportation (Non-Medical): No  Physical Activity: Not on file  Stress: Not on file  Social Connections: Not on file  Intimate Partner Violence: Not on file    FAMILY HISTORY: Family History  Problem Relation Age of  Onset   Hyperlipidemia Mother    Diabetes Father    Hypertension Father    Bladder Cancer Father    Hypertension Maternal Grandmother    Diabetes Maternal Grandmother    Kidney disease Maternal Grandmother    CAD Maternal Grandfather        died of MI at 48   Hypertension Maternal Grandfather    CAD Paternal Grandmother    Diabetes Paternal Grandmother     ALLERGIES:  has No Known Allergies.  MEDICATIONS:  Current Outpatient Medications  Medication Sig Dispense Refill   amLODipine (NORVASC) 5 MG tablet Take 1 tablet (5 mg total) by mouth daily. 90 tablet 3   aspirin 81 MG chewable tablet Chew 1 tablet (81 mg total) by mouth daily. 90 tablet 1   Blood Glucose Monitoring Suppl (TRUE METRIX METER) w/Device KIT Use as directed 4 (four) times daily -  before meals and at bedtime. 1 kit 0   glucose blood (TRUE METRIX BLOOD GLUCOSE TEST) test strip Test 4 (four) times daily -  before meals and at bedtime. Use as instructed 100 each 12   hydrOXYzine (VISTARIL) 25 MG capsule Take 1 capsule (25 mg total) by mouth every 8 (eight) hours as needed for anxiety. 30 capsule 1   Insulin Glargine (BASAGLAR KWIKPEN) 100 UNIT/ML Inject 10 Units into the skin at bedtime. 6 mL 0   Insulin Pen Needle (PEN NEEDLES) 31G X 8 MM MISC Use as directed at bedtime. 100 each 6   losartan-hydrochlorothiazide (HYZAAR) 100-25 MG tablet Take 1 tablet by mouth daily. 90 tablet 3   metFORMIN (GLUCOPHAGE) 1000 MG tablet Take 1 tablet (1,000 mg total) by mouth 2 (two) times daily with a meal. 180 tablet 0   metoprolol tartrate (LOPRESSOR) 25 MG tablet Take 1 tablet (25 mg total) by mouth 2 (two) times daily. 180 tablet 2   rosuvastatin (CRESTOR) 40 MG tablet Take 1 tablet (40 mg total) by mouth daily. 90 tablet 3   TRUEplus Lancets 28G MISC Test 4 (four) times daily -  before meals and at bedtime. Use as directed 100 each 4   No current facility-administered medications for this visit.   Facility-Administered Medications  Ordered in Other Visits  Medication Dose Route Frequency Provider Last Rate Last Admin   regadenoson (LEXISCAN) injection SOLN 0.4 mg  0.4 mg Intravenous Once Maisie Fus, MD        REVIEW OF SYSTEMS:   Constitutional: Denies fevers, chills or abnormal night sweats Eyes: Denies blurriness of vision, double vision or watery eyes Ears, nose, mouth, throat, and face: Denies mucositis or sore throat Respiratory: Denies cough, dyspnea or wheezes Cardiovascular: Denies palpitation, chest discomfort or lower extremity swelling Gastrointestinal:  Denies nausea, heartburn or change in bowel habits Skin: Denies abnormal skin rashes Lymphatics: Denies new lymphadenopathy or easy bruising Neurological:Denies numbness, tingling or new  weaknesses Behavioral/Psych: Mood is stable, no new changes  All other systems were reviewed with the patient and are negative.  PHYSICAL EXAMINATION: ECOG PERFORMANCE STATUS: 0 - Asymptomatic  Vitals:   06/13/23 1104  BP: 136/84  Pulse: 64  Resp: 20  Temp: 97.9 F (36.6 C)  SpO2: 99%   Filed Weights   06/13/23 1104  Weight: 275 lb 1.6 oz (124.8 kg)    GENERAL:alert, no distress and comfortable SKIN: skin color, texture, turgor are normal, no rashes or significant lesions EYES: normal, conjunctiva are pink and non-injected, sclera clear OROPHARYNX:no exudate, no erythema and lips, buccal mucosa, and tongue normal  NECK: supple, thyroid normal size, non-tender, without nodularity LYMPH:  no palpable lymphadenopathy in the cervical, axillary or inguinal LUNGS: clear to auscultation and percussion with normal breathing effort HEART: regular rate & rhythm and no murmurs and no lower extremity edema ABDOMEN:abdomen soft, non-tender and normal bowel sounds Musculoskeletal:no cyanosis of digits and no clubbing  PSYCH: alert & oriented x 3 with fluent speech NEURO: no focal motor/sensory deficits  LABORATORY DATA:  I have reviewed the data as listed     Latest Ref Rng & Units 05/18/2023    2:39 PM 11/12/2022    3:11 PM 02/05/2022   11:29 AM  CBC  WBC 3.4 - 10.8 x10E3/uL 13.0  11.1  10.4   Hemoglobin 13.0 - 17.7 g/dL 82.9  56.2  13.0   Hematocrit 37.5 - 51.0 % 47.8  49.5  49.4   Platelets 150 - 450 x10E3/uL 285  277  244       Latest Ref Rng & Units 05/18/2023    2:39 PM 05/08/2023    9:13 AM 03/04/2023    9:36 AM  CMP  Glucose 70 - 99 mg/dL  865  784   BUN 6 - 24 mg/dL  13  12   Creatinine 6.96 - 1.27 mg/dL  2.95  2.84   Sodium 132 - 144 mmol/L  139  137   Potassium 3.5 - 5.2 mmol/L  4.3  4.4   Chloride 96 - 106 mmol/L  99  100   CO2 20 - 29 mmol/L  22  21   Calcium 8.7 - 10.2 mg/dL  9.5  9.6   Total Protein 6.0 - 8.5 g/dL   7.3   Total Bilirubin 0.0 - 1.2 mg/dL   0.4   Alkaline Phos 44 - 121 IU/L   117   AST 0 - 40 IU/L   39   ALT 0 - 44 IU/L 70   61   \   RADIOGRAPHIC STUDIES: I have personally reviewed the radiological images as listed and agreed with the findings in the report. No results found.  ASSESSMENT & PLAN:  57 year old gentleman with past medical history of smoking, coronary artery disease, diabetes, hypertension, obesity, presented with mild leukocytosis  Mild leukocytosis -He had mild leukocytosis with predominant neutrophil in 2020 when he had a heart attack, likely reactive.  WBC was normal in 2023. -He was noticed to have mild elevated WBC 11.1-13K in the past 7 months, no differential was done. -I suspect that this is reactive.  He used to smoke heavily, but has quit.  He has poor dental health, no clinical concern for acute infection. -I will repeat a CBC with differentiation.  If he has persistent neutrophilia, I think is also reasonable to rule out a CML, will obtain BCR/ABL FISH  -I will call him with the results, if negative, I will see  him as needed.  Plan -lab next week and we will call him with results -Follow-up as needed if BCR/ABL FISH  negative    Orders Placed This Encounter  Procedures    CBC with Differential (Cancer Center Only)    Standing Status:   Future    Standing Expiration Date:   06/12/2024   BCR ABL1 FISH ([facility])    Standing Status:   Future    Standing Expiration Date:   06/12/2024    All questions were answered. The patient knows to call the clinic with any problems, questions or concerns. I spent 25 minutes counseling the patient face to face. The total time spent in the appointment was 30 minutes and more than 50% was on counseling.     Malachy Mood, MD 06/13/2023 11:25 AM

## 2023-06-13 ENCOUNTER — Inpatient Hospital Stay: Payer: 59

## 2023-06-13 ENCOUNTER — Other Ambulatory Visit: Payer: Self-pay

## 2023-06-13 ENCOUNTER — Encounter: Payer: Self-pay | Admitting: Hematology

## 2023-06-13 ENCOUNTER — Inpatient Hospital Stay: Payer: 59 | Attending: Nurse Practitioner | Admitting: Hematology

## 2023-06-13 VITALS — BP 136/84 | HR 64 | Temp 97.9°F | Resp 20 | Ht 73.0 in | Wt 275.1 lb

## 2023-06-13 DIAGNOSIS — G4733 Obstructive sleep apnea (adult) (pediatric): Secondary | ICD-10-CM | POA: Diagnosis not present

## 2023-06-13 DIAGNOSIS — Z841 Family history of disorders of kidney and ureter: Secondary | ICD-10-CM | POA: Diagnosis not present

## 2023-06-13 DIAGNOSIS — Z8052 Family history of malignant neoplasm of bladder: Secondary | ICD-10-CM | POA: Diagnosis not present

## 2023-06-13 DIAGNOSIS — Z5986 Financial insecurity: Secondary | ICD-10-CM | POA: Diagnosis not present

## 2023-06-13 DIAGNOSIS — E669 Obesity, unspecified: Secondary | ICD-10-CM | POA: Insufficient documentation

## 2023-06-13 DIAGNOSIS — D72825 Bandemia: Secondary | ICD-10-CM

## 2023-06-13 DIAGNOSIS — E785 Hyperlipidemia, unspecified: Secondary | ICD-10-CM | POA: Diagnosis not present

## 2023-06-13 DIAGNOSIS — I252 Old myocardial infarction: Secondary | ICD-10-CM | POA: Insufficient documentation

## 2023-06-13 DIAGNOSIS — Z833 Family history of diabetes mellitus: Secondary | ICD-10-CM | POA: Insufficient documentation

## 2023-06-13 DIAGNOSIS — Z87891 Personal history of nicotine dependence: Secondary | ICD-10-CM | POA: Insufficient documentation

## 2023-06-13 DIAGNOSIS — Z79899 Other long term (current) drug therapy: Secondary | ICD-10-CM | POA: Insufficient documentation

## 2023-06-13 DIAGNOSIS — Z83438 Family history of other disorder of lipoprotein metabolism and other lipidemia: Secondary | ICD-10-CM | POA: Insufficient documentation

## 2023-06-13 DIAGNOSIS — I10 Essential (primary) hypertension: Secondary | ICD-10-CM | POA: Insufficient documentation

## 2023-06-13 DIAGNOSIS — D72829 Elevated white blood cell count, unspecified: Secondary | ICD-10-CM | POA: Diagnosis not present

## 2023-06-13 DIAGNOSIS — I2510 Atherosclerotic heart disease of native coronary artery without angina pectoris: Secondary | ICD-10-CM | POA: Diagnosis not present

## 2023-06-13 DIAGNOSIS — E119 Type 2 diabetes mellitus without complications: Secondary | ICD-10-CM | POA: Insufficient documentation

## 2023-06-13 DIAGNOSIS — Z8249 Family history of ischemic heart disease and other diseases of the circulatory system: Secondary | ICD-10-CM | POA: Diagnosis not present

## 2023-06-16 ENCOUNTER — Encounter: Payer: 59 | Admitting: Nurse Practitioner

## 2023-06-16 ENCOUNTER — Other Ambulatory Visit: Payer: 59

## 2023-06-17 ENCOUNTER — Inpatient Hospital Stay: Payer: 59

## 2023-06-30 DIAGNOSIS — E119 Type 2 diabetes mellitus without complications: Secondary | ICD-10-CM | POA: Diagnosis not present

## 2023-06-30 DIAGNOSIS — H53031 Strabismic amblyopia, right eye: Secondary | ICD-10-CM | POA: Diagnosis not present

## 2023-07-14 ENCOUNTER — Telehealth: Payer: Self-pay | Admitting: Family

## 2023-08-03 ENCOUNTER — Other Ambulatory Visit (HOSPITAL_COMMUNITY): Payer: Self-pay

## 2023-10-18 ENCOUNTER — Emergency Department (HOSPITAL_BASED_OUTPATIENT_CLINIC_OR_DEPARTMENT_OTHER): Payer: 59 | Admitting: Radiology

## 2023-10-18 ENCOUNTER — Other Ambulatory Visit: Payer: Self-pay

## 2023-10-18 ENCOUNTER — Emergency Department (HOSPITAL_BASED_OUTPATIENT_CLINIC_OR_DEPARTMENT_OTHER)
Admission: EM | Admit: 2023-10-18 | Discharge: 2023-10-18 | Disposition: A | Discharge status: Home / Self Care | Payer: 59 | Attending: Emergency Medicine | Admitting: Emergency Medicine

## 2023-10-18 ENCOUNTER — Encounter (HOSPITAL_BASED_OUTPATIENT_CLINIC_OR_DEPARTMENT_OTHER): Payer: Self-pay | Admitting: Emergency Medicine

## 2023-10-18 DIAGNOSIS — J9811 Atelectasis: Secondary | ICD-10-CM | POA: Diagnosis not present

## 2023-10-18 DIAGNOSIS — J111 Influenza due to unidentified influenza virus with other respiratory manifestations: Secondary | ICD-10-CM

## 2023-10-18 DIAGNOSIS — I10 Essential (primary) hypertension: Secondary | ICD-10-CM | POA: Insufficient documentation

## 2023-10-18 DIAGNOSIS — Z79899 Other long term (current) drug therapy: Secondary | ICD-10-CM | POA: Diagnosis not present

## 2023-10-18 DIAGNOSIS — Z7982 Long term (current) use of aspirin: Secondary | ICD-10-CM | POA: Diagnosis not present

## 2023-10-18 DIAGNOSIS — R058 Other specified cough: Secondary | ICD-10-CM | POA: Diagnosis not present

## 2023-10-18 DIAGNOSIS — Z20822 Contact with and (suspected) exposure to covid-19: Secondary | ICD-10-CM | POA: Diagnosis not present

## 2023-10-18 DIAGNOSIS — R059 Cough, unspecified: Secondary | ICD-10-CM | POA: Diagnosis not present

## 2023-10-18 DIAGNOSIS — R0989 Other specified symptoms and signs involving the circulatory and respiratory systems: Secondary | ICD-10-CM | POA: Diagnosis not present

## 2023-10-18 LAB — RESP PANEL BY RT-PCR (RSV, FLU A&B, COVID)  RVPGX2
Influenza A by PCR: POSITIVE — AB
Influenza B by PCR: NEGATIVE
Resp Syncytial Virus by PCR: NEGATIVE
SARS Coronavirus 2 by RT PCR: NEGATIVE

## 2023-10-18 LAB — CBC WITH DIFFERENTIAL/PLATELET
Abs Immature Granulocytes: 0.07 10*3/uL (ref 0.00–0.07)
Basophils Absolute: 0 10*3/uL (ref 0.0–0.1)
Basophils Relative: 0 %
Eosinophils Absolute: 0.1 10*3/uL (ref 0.0–0.5)
Eosinophils Relative: 1 %
HCT: 50.7 % (ref 39.0–52.0)
Hemoglobin: 17.5 g/dL — ABNORMAL HIGH (ref 13.0–17.0)
Immature Granulocytes: 1 %
Lymphocytes Relative: 22 %
Lymphs Abs: 1.7 10*3/uL (ref 0.7–4.0)
MCH: 29.7 pg (ref 26.0–34.0)
MCHC: 34.5 g/dL (ref 30.0–36.0)
MCV: 85.9 fL (ref 80.0–100.0)
Monocytes Absolute: 1.4 10*3/uL — ABNORMAL HIGH (ref 0.1–1.0)
Monocytes Relative: 18 %
Neutro Abs: 4.5 10*3/uL (ref 1.7–7.7)
Neutrophils Relative %: 58 %
Platelets: 208 10*3/uL (ref 150–400)
RBC: 5.9 MIL/uL — ABNORMAL HIGH (ref 4.22–5.81)
RDW: 13.3 % (ref 11.5–15.5)
WBC: 7.7 10*3/uL (ref 4.0–10.5)
nRBC: 0 % (ref 0.0–0.2)

## 2023-10-18 LAB — COMPREHENSIVE METABOLIC PANEL
ALT: 81 U/L — ABNORMAL HIGH (ref 0–44)
AST: 82 U/L — ABNORMAL HIGH (ref 15–41)
Albumin: 4.7 g/dL (ref 3.5–5.0)
Alkaline Phosphatase: 83 U/L (ref 38–126)
Anion gap: 13 (ref 5–15)
BUN: 24 mg/dL — ABNORMAL HIGH (ref 6–20)
CO2: 19 mmol/L — ABNORMAL LOW (ref 22–32)
Calcium: 9.3 mg/dL (ref 8.9–10.3)
Chloride: 100 mmol/L (ref 98–111)
Creatinine, Ser: 1.34 mg/dL — ABNORMAL HIGH (ref 0.61–1.24)
GFR, Estimated: >60 mL/min (ref >60)
Glucose, Bld: 272 mg/dL — ABNORMAL HIGH (ref 70–99)
Potassium: 4 mmol/L (ref 3.5–5.1)
Sodium: 132 mmol/L — ABNORMAL LOW (ref 135–145)
Total Bilirubin: 0.7 mg/dL (ref 0.0–1.2)
Total Protein: 8.1 g/dL (ref 6.5–8.1)

## 2023-10-18 MED ORDER — LACTATED RINGERS IV BOLUS
1000.0000 mL | Freq: Once | INTRAVENOUS | Status: AC
Start: 2023-10-18 — End: 2023-10-18
  Administered 2023-10-18: 1000 mL via INTRAVENOUS

## 2023-10-18 MED ORDER — BENZONATATE 100 MG PO CAPS
100.0000 mg | ORAL_CAPSULE | Freq: Three times a day (TID) | ORAL | 0 refills | Status: DC
  Filled 2023-10-18: qty 21, 7d supply, fill #0

## 2023-10-18 NOTE — Discharge Instructions (Addendum)
 You tested positive for the flu today.  Your COVID, RSV are negative.  Your chest x-ray does not show any signs of pneumonia.  Your creatinine which is a measure of your kidney function was elevated outside of normal range today.  This is likely because you are dehydrated.  Please continue to keep well-hydrated at home.  Your blood sugar and liver labs were also slightly elevated today.  Your blood pressure was also elevated today.  Please have your kidney function (creatinine) and liver labs (AST and ALT) rechecked by your primary within the next week.  Please also have your primary keep an eye on your blood sugars as this may be a sign of prediabetes or diabetes.  Please have your primary care provider recheck your blood pressure within the next week as they may need to adjust your medications.  Medications prescribed:   You have been prescribed Tessalon  Perles to help with your cough.  Please take this as prescribed.  Home care instructions:  You can take Tylenol  and/or Ibuprofen as directed on the packaging for fever reduction and pain relief.    For cough: honey 1/2 to 1 teaspoon (you can dilute the honey in water or another fluid).  You can also use guaifenesin and dextromethorphan for cough which are over-the-counter medications. You can use a humidifier for chest congestion and cough.  If you don't have a humidifier, you can sit in the bathroom with the hot shower running.      For sore throat: try warm salt water gargles, cepacol lozenges, throat spray, warm tea or water with lemon/honey, popsicles or ice, or OTC cold relief medicine for throat discomfort.    For congestion: Flonase 1-2 sprays in each nostril daily.    It is important to stay hydrated: drink plenty of fluids (water, gatorade/powerade/pedialyte, juices, or teas) to keep your throat moisturized and help further relieve irritation/discomfort.   Your illness is contagious and can be spread to others, especially during  the first 3 or 4 days. It cannot be cured by antibiotics or other medicines. Take basic precautions such as wearing a mask, washing your hands often, covering your mouth when you cough or sneeze, and avoiding public places where you could spread your illness to others.   You may return to normal activities (work/school) when:  - You are having improvement in symptoms  - AND have had resolution of fever without the use of fever-reducing medications for 24 hours  Follow-up instructions: Please follow-up with your primary care provider for further evaluation of your symptoms if you are not feeling better within the next 5 days. Please follow up with them for repeat labs within the next week.   Return instructions:  Please return to the Emergency Department if you experience worsening symptoms.  RETURN IMMEDIATELY IF you develop shortness of breath, confusion or altered mental status, a new rash, become dizzy, faint, or poorly responsive, or are unable to be cared for at home. Please return if you have persistent vomiting and cannot keep down fluids or develop a fever that is not controlled by tylenol  or motrin.   Please return if you have any other emergent concerns.

## 2023-10-18 NOTE — ED Notes (Signed)
 RN reviewed discharge instructions with pt. Pt verbalized understanding and had no further questions. VSS upon discharge.

## 2023-10-18 NOTE — ED Triage Notes (Signed)
 Pt via pov from home with productive cough - brownish phlegm. Pt reports that he has been really tired as well. Denies nasal congestion or sore throat. Pt alert & oriented, nad noted.

## 2023-10-18 NOTE — ED Triage Notes (Signed)
 Kevin [name]

## 2023-10-18 NOTE — ED Provider Notes (Signed)
 [place] EMERGENCY DEPARTMENT AT [facility] Provider Note   CSN: [id number] Arrival date & time: 10/18/23  1747     History  Chief Complaint  Patient presents with   Cough    Kevin [name] is a 58 y.o. male with history of hypertension, hyperlipidemia, prediabetes, NSTEMI, presents with concern for cough that has been ongoing for the past week.  States he is coughing up some greenish clear phlegm.  Also reports being more tired than normal and having decreased appetite.  Denies any shortness of breath or chest pain.  Denies any nausea, vomiting, abdominal pain.  Denies any fever or chills.   Cough      Home Medications Prior to Admission medications   Medication Sig Start Date End Date Taking? Authorizing Provider  benzonatate  (TESSALON ) 100 MG capsule Take 1 capsule (100 mg total) by mouth every 8 (eight) hours. 10/18/23  Yes Veta Palma, PA-C  amLODipine  (NORVASC ) 5 MG tablet Take 1 tablet (5 mg total) by mouth daily. 01/28/23   Lelon Hamilton T, PA-C  aspirin  81 MG chewable tablet Chew 1 tablet (81 mg total) by mouth daily. 04/07/19   Henry Manuelita NOVAK, NP  Blood Glucose Monitoring Suppl (TRUE METRIX METER) w/Device KIT Use as directed 4 (four) times daily -  before meals and at bedtime. 05/18/23   Lorren Greig PARAS, NP  glucose blood (TRUE METRIX BLOOD GLUCOSE TEST) test strip Test 4 (four) times daily -  before meals and at bedtime. Use as instructed 05/18/23   Lorren Greig PARAS, NP  hydrOXYzine  (VISTARIL ) 25 MG capsule Take 1 capsule (25 mg total) by mouth every 8 (eight) hours as needed for anxiety. 05/18/23   Lorren Greig PARAS, NP  Insulin  Glargine (BASAGLAR  KWIKPEN) 100 UNIT/ML Inject 10 Units into the skin at bedtime. 05/19/23   Lorren Greig PARAS, NP  Insulin  Pen Needle (PEN NEEDLES) 31G X 8 MM MISC Use as directed at bedtime. 05/19/23   Lorren Greig PARAS, NP  losartan -hydrochlorothiazide  (HYZAAR ) 100-25 MG tablet Take 1 tablet by mouth daily. 04/22/23   Lelon Hamilton T,  PA-C  metFORMIN  (GLUCOPHAGE ) 1000 MG tablet Take 1 tablet (1,000 mg total) by mouth 2 (two) times daily with a meal. 05/18/23 09/04/23  Lorren Greig PARAS, NP  metoprolol  tartrate (LOPRESSOR ) 25 MG tablet Take 1 tablet (25 mg total) by mouth 2 (two) times daily. 03/26/23   Shlomo Wilbert SAUNDERS, MD  rosuvastatin  (CRESTOR ) 40 MG tablet Take 1 tablet (40 mg total) by mouth daily. 02/18/23   Lelon Hamilton T, PA-C  TRUEplus Lancets 28G MISC Test 4 (four) times daily -  before meals and at bedtime. Use as directed 05/18/23   Lorren Greig PARAS, NP      Allergies    Patient has no known allergies.    Review of Systems   Review of Systems  Respiratory:  Positive for cough.     Physical Exam Updated Vital Signs BP (!) 137/91   Pulse 96   Temp 98.8 F (37.1 C)   Resp 17   Ht 6' 1 (1.854 m)   Wt 124.8 kg   SpO2 92%   BMI 36.30 kg/m  Physical Exam Vitals and nursing note reviewed.  Constitutional:      General: He is not in acute distress.    Appearance: He is well-developed.  HENT:     Head: Normocephalic and atraumatic.  Eyes:     Conjunctiva/sclera: Conjunctivae normal.  Cardiovascular:     Rate and Rhythm: Normal  rate and regular rhythm.     Heart sounds: No murmur heard. Pulmonary:     Effort: Pulmonary effort is normal. No respiratory distress.     Breath sounds: Normal breath sounds.     Comments: Breathing comfortably on room air, talking in full sentences Abdominal:     Palpations: Abdomen is soft.     Tenderness: There is no abdominal tenderness.  Musculoskeletal:        General: No swelling.     Cervical back: Neck supple.  Skin:    General: Skin is warm and dry.     Capillary Refill: Capillary refill takes less than 2 seconds.  Neurological:     Mental Status: He is alert.  Psychiatric:        Mood and Affect: Mood normal.     ED Results / Procedures / Treatments   Labs (all labs ordered are listed, but only abnormal results are displayed) Labs Reviewed  RESP PANEL BY  RT-PCR (RSV, FLU A&B, COVID)  RVPGX2 - Abnormal; Notable for the following components:      Result Value   Influenza A by PCR POSITIVE (*)    All other components within normal limits  CBC WITH DIFFERENTIAL/PLATELET - Abnormal; Notable for the following components:   RBC 5.90 (*)    Hemoglobin 17.5 (*)    Monocytes Absolute 1.4 (*)    All other components within normal limits  COMPREHENSIVE METABOLIC PANEL - Abnormal; Notable for the following components:   Sodium 132 (*)    CO2 19 (*)    Glucose, Bld 272 (*)    BUN 24 (*)    Creatinine, Ser 1.34 (*)    AST 82 (*)    ALT 81 (*)    All other components within normal limits    EKG None  Radiology DG Chest 2 View Result Date: 10/18/2023 CLINICAL DATA:  Productive cough. EXAM: CHEST - 2 VIEW COMPARISON:  November 12, 2022 FINDINGS: The heart size and mediastinal contours are within normal limits. A lung volumes are noted with very mild atelectasis is seen within the bilateral lung bases, left greater than right. No pleural effusion or pneumothorax is identified. The visualized skeletal structures are unremarkable. IMPRESSION: Low lung volumes with very mild bibasilar atelectasis. Electronically Signed   By: Suzen Dials M.D.   On: 10/18/2023 19:33    Procedures Procedures    Medications Ordered in ED Medications  lactated ringers  bolus 1,000 mL (0 mLs Intravenous Stopped 10/18/23 2145)    ED Course/ Medical Decision Making/ A&P                                 Medical Decision Making Amount and/or Complexity of Data Reviewed Labs: ordered. Radiology: ordered.  Risk Prescription drug management.     Differential diagnosis includes but is not limited to COVID, flu, RSV, viral URI, strep pharyngitis, viral pharyngitis, allergic rhinitis, pneumonia, bronchitis   ED Course:  Patient overall well-appearing.  Stable vital signs aside from a slightly elevated blood pressure 138/101.  Does have a dry cough, although  breathing comfortably on room air.  Lungs clear to auscultation bilaterally.  I independently reviewed and interpreted chest x-ray, no consolidations concerning for pneumonia.  He tested positive for flu today.  Negative for COVID and RSV.  Given symptoms have been ongoing for a week, outside of window for Tamiflu.   I Ordered, and personally interpreted labs.  The pertinent results include:   CMP with creatinine of 1.34 this is increased from his last creatinine of 1.19 taken 5 months ago.  He was given an LR bolus as he states he has not been eating much due to decreased appetite.  This bump in creatinine is likely prerenal.  CMP also with glucose of 272, hyponatremia of 132, slight elevation in AST and ALT at 82 and 81 respectively CBC without leukocytosis  Low concern for any other acute pathology at this time.  Patient stable and appropriate for discharge home at this time.  Impression: Influenza A Increased creatinine  Disposition:  The patient was discharged home with instructions to keep well-hydrated at home with water and electrolyte drinks such as Pedialyte.  I encouraged him to increase oral intake with easy foods such as toast, applesauce, soups and broths.  I recommended he try over-the-counter dextromethorphan for cough.  I have also prescribed him Tessalon  Perles if over-the-counter medications are not beneficial.  Discussed that he needs to follow-up with his primary within the next week for repeat labs to check his creatinine, liver function.  Also needs to follow-up with primary regarding blood sugar management and blood pressure management within the next month.  Return precautions given.              Final Clinical Impression(s) / ED Diagnoses Final diagnoses:  Flu    Rx / DC Orders ED Discharge Orders          Ordered    benzonatate  (TESSALON ) 100 MG capsule  Every 8 hours        10/18/23 2132              Veta Palma, PA-C 10/18/23  2149    Cottie Donnice PARAS, MD 10/18/23 2229

## 2023-10-19 ENCOUNTER — Other Ambulatory Visit (HOSPITAL_COMMUNITY): Payer: Self-pay

## 2023-11-02 ENCOUNTER — Ambulatory Visit: Payer: 59 | Attending: Cardiovascular Disease | Admitting: Cardiovascular Disease

## 2023-11-02 ENCOUNTER — Encounter: Payer: Self-pay | Admitting: Cardiovascular Disease

## 2023-11-02 VITALS — BP 110/70 | HR 74 | Ht 72.0 in | Wt 263.2 lb

## 2023-11-02 DIAGNOSIS — I10 Essential (primary) hypertension: Secondary | ICD-10-CM

## 2023-11-02 DIAGNOSIS — E782 Mixed hyperlipidemia: Secondary | ICD-10-CM | POA: Diagnosis not present

## 2023-11-02 DIAGNOSIS — E1159 Type 2 diabetes mellitus with other circulatory complications: Secondary | ICD-10-CM

## 2023-11-02 DIAGNOSIS — K760 Fatty (change of) liver, not elsewhere classified: Secondary | ICD-10-CM | POA: Diagnosis not present

## 2023-11-02 DIAGNOSIS — I2510 Atherosclerotic heart disease of native coronary artery without angina pectoris: Secondary | ICD-10-CM | POA: Diagnosis not present

## 2023-11-02 NOTE — Patient Instructions (Signed)
Medication Instructions:  Your physician recommends that you continue on your current medications as directed. Please refer to the Current Medication list given to you today.  *If you need a refill on your cardiac medications before your next appointment, please call your pharmacy*  Lab Work: None ordered today.  Testing/Procedures: None ordered today.  Follow-Up: At [facility], you and your health needs are our priority.  As part of our continuing mission to provide you with exceptional heart care, we have created designated Provider Care Teams.  These Care Teams include your primary Cardiologist (physician) and Advanced Practice Providers (APPs -  Physician Assistants and Nurse Practitioners) who all work together to provide you with the care you need, when you need it.  Your next appointment:   1 year(s)  The format for your next appointment:   In Person  Provider:   Tereso Newcomer, PA-C

## 2023-11-02 NOTE — Assessment & Plan Note (Signed)
Patient is stable without symptoms of angina.  He remains on aspirin for antiplatelet therapy as well as a high intensity statin drug.

## 2023-11-02 NOTE — Assessment & Plan Note (Signed)
Recent transaminases moderately elevated with ALT of 81.  Working on weight loss.

## 2023-11-02 NOTE — Assessment & Plan Note (Signed)
Followed by his PCP.  He has lost weight and reports better glycemic control at home.  He continues on an ARB as well as metformin.  He was unable to afford insulin.

## 2023-11-02 NOTE — Assessment & Plan Note (Signed)
Treated with high intensity statin drug.  Improving his lifestyle.  LDL 63, HDL 29, triglycerides 135, cholesterol 116

## 2023-11-02 NOTE — Assessment & Plan Note (Signed)
Patient's blood pressure is well-controlled on a combination of amlodipine, losartan/HCTZ, metoprolol.  Continue current management.  Recent creatinine is 1.34 and potassium is 4.0.  Encouraged healthy lifestyle, continue to focus on weight loss and healthy diet.

## 2023-11-02 NOTE — Progress Notes (Signed)
Cardiology Office Note:    Date:  11/02/2023   ID:  [name], DOB August 09, 1966, MRN [id number]  PCP:  Rema Fendt, NP   [facility] Providers Cardiologist:  Tonny Bollman, MD     Referring MD: Rema Fendt, NP   Chief Complaint  Patient presents with   Coronary Artery Disease    History of Present Illness:    MONTARIO ZILKA is a 58 y.o. male with a hx of:  Coronary artery disease  S/p NSTEMI 03/2019 >> PCI: 3 x 12 mm DES to pLAD LHC 04/05/2019: LAD ostial 95, mid 30, distal 50; OM1 50, OM2 70 (small); RCA proximal 50 Myoview 11/17/2022: EF 67, normal perfusion, low risk TTE 02/25/22: EF 60-65, no RWMA, normal RVSF, trivial MR, RAP 3 TTE 12/09/2022: EF 60-65, no RWMA, normal RVSF, mild LAE, RAP 3, no valvular disease Hyperlipidemia  Hypertension  Diabetes mellitus  Sleep apnea  Fatty Liver   The patient is here alone today.  He is doing pretty well from a cardiac perspective.  He denies any chest pain or pressure, heart palpitations, or leg edema.  He does have exertional dyspnea.  He has a history of tobacco abuse but no longer smokes.  He had an echocardiogram last year that showed normal LV and RV function with no significant valvular disease.  He has no other complaints at this time.  His last hemoglobin A1c was elevated at 10.5.  He has lost significant weight since that time and is doing better with respect to his diabetes.  He states that he recently switched from regular soda to diet soda.  Current Medications: Current Meds  Medication Sig   amLODipine (NORVASC) 5 MG tablet Take 1 tablet (5 mg total) by mouth daily.   aspirin 81 MG chewable tablet Chew 1 tablet (81 mg total) by mouth daily.   losartan-hydrochlorothiazide (HYZAAR) 100-25 MG tablet Take 1 tablet by mouth daily.   metFORMIN (GLUCOPHAGE) 1000 MG tablet Take 1 tablet (1,000 mg total) by mouth 2 (two) times daily with a meal.   metoprolol tartrate (LOPRESSOR) 25 MG tablet Take 1 tablet  (25 mg total) by mouth 2 (two) times daily.   rosuvastatin (CRESTOR) 40 MG tablet Take 1 tablet (40 mg total) by mouth daily.   [DISCONTINUED] benzonatate (TESSALON) 100 MG capsule Take 1 capsule (100 mg total) by mouth every 8 (eight) hours.   [DISCONTINUED] Blood Glucose Monitoring Suppl (TRUE METRIX METER) w/Device KIT Use as directed 4 (four) times daily -  before meals and at bedtime.   [DISCONTINUED] glucose blood (TRUE METRIX BLOOD GLUCOSE TEST) test strip Test 4 (four) times daily -  before meals and at bedtime. Use as instructed   [DISCONTINUED] hydrOXYzine (VISTARIL) 25 MG capsule Take 1 capsule (25 mg total) by mouth every 8 (eight) hours as needed for anxiety.   [DISCONTINUED] Insulin Glargine (BASAGLAR KWIKPEN) 100 UNIT/ML Inject 10 Units into the skin at bedtime.   [DISCONTINUED] Insulin Pen Needle (PEN NEEDLES) 31G X 8 MM MISC Use as directed at bedtime.   [DISCONTINUED] TRUEplus Lancets 28G MISC Test 4 (four) times daily -  before meals and at bedtime. Use as directed     Allergies:   Patient has no known allergies.   ROS:   Please see the history of present illness.    All other systems reviewed and are negative.  EKGs/Labs/Other Studies Reviewed:    The following studies were reviewed today: Cardiac Studies & Procedures  CARDIAC CATHETERIZATION  CARDIAC CATHETERIZATION 04/05/2019  Narrative  A drug-eluting stent was successfully placed using a STENT SYNERGY DES 3X12.  Post intervention, there is a 0% residual stenosis.  1.  Multivessel coronary artery disease with critical stenosis of the proximal/ostial LAD (culprit vessel), moderate stenosis of the ostial OM branches, and moderate stenosis of the mid RCA 2.  Normal LV systolic function with no regional wall motion abnormalities and normal LVEDP 3.  Successful PCI of the proximal LAD using a 3.0 x 12 mm Synergy DES postdilated with a 3.5 mm noncompliant balloon to high pressure  Recommendations: Aspirin and  ticagrelor without interruption for a minimum of 12 months.  Aggressive medical therapy for residual CAD.  Findings Coronary Findings Diagnostic  Dominance: Right  Left Anterior Descending Ost LAD lesion is 95% stenosed. The lesion is eccentric. Mid LAD lesion is 30% stenosed. Dist LAD lesion is 50% stenosed.  Left Circumflex  First Obtuse Marginal Branch 1st Mrg lesion is 50% stenosed. High OM  Second Obtuse Marginal Branch 2nd Mrg lesion is 70% stenosed. small vessel  Right Coronary Artery Prox RCA to Mid RCA lesion is 50% stenosed. hypodense plaque  Intervention  Ost LAD lesion Stent CATH VISTA GUIDE 6FR XBLAD3.5 guide catheter was inserted. Lesion crossed with guidewire using a WIRE COUGAR XT STRL 190CM. Pre-stent angioplasty was performed using a BALLOON SAPPHIRE 2.5X12. A drug-eluting stent was successfully placed using a STENT SYNERGY DES 3X12. Post-stent angioplasty was performed using a BALLOON SAPPHIRE [place] 3.5X10. Maximum pressure:  16 atm. Post-Intervention Lesion Assessment The intervention was successful. Pre-interventional TIMI flow is 3. Post-intervention TIMI flow is 3. No complications occurred at this lesion. There is a 0% residual stenosis post intervention.   STRESS TESTS  MYOCARDIAL PERFUSION IMAGING 11/17/2022  Narrative   The study is normal. The study is low risk.   No ST deviation was noted.   Left ventricular function is normal. Nuclear stress EF: 67 %. The left ventricular ejection fraction is hyperdynamic (>65%). End diastolic cavity size is normal.  ECHOCARDIOGRAM  ECHOCARDIOGRAM COMPLETE 12/09/2022  Narrative ECHOCARDIOGRAM REPORT    Patient Name:   ZAYDN GUTRIDGE Date of Exam: 12/09/2022 Medical Rec #:  [id number]         Height:       73.0 in Accession #:    [id number]        Weight:       282.0 lb Date of Birth:  Jan 21, 1966          BSA:          2.490 m Patient Age:    56 years          BP:           150/99 mmHg Patient Gender: M                  HR:           64 bpm. Exam Location:  [address]  Procedure: 2D Echo, 3D Echo, Cardiac Doppler and Color Doppler  Indications:    R06.02 Shortness of Breath  History:        Patient has prior history of Echocardiogram examinations, most recent 02/25/2022. CAD and Previous Myocardial Infarction, Signs/Symptoms:Shortness of Breath; Risk Factors:Family History of Coronary Artery Disease, Hypertension, Dyslipidemia, Former Smoker and Sleep Apnea.  Sonographer:    Farrel Conners RDCS Referring Phys: Evern Bio WEAVER  IMPRESSIONS   1. Left ventricular ejection fraction, by estimation, is 60 to 65%. Left  ventricular ejection fraction by 3D volume is 62 %. The left ventricle has normal function. The left ventricle has no regional wall motion abnormalities. Left ventricular diastolic parameters were normal. 2. Right ventricular systolic function is normal. The right ventricular size is normal. 3. Left atrial size was mildly dilated. 4. The mitral valve is normal in structure. No evidence of mitral valve regurgitation. No evidence of mitral stenosis. 5. The aortic valve is tricuspid. Aortic valve regurgitation is not visualized. No aortic stenosis is present. 6. The inferior vena cava is normal in size with greater than 50% respiratory variability, suggesting right atrial pressure of 3 mmHg.  Comparison(s): No significant change from prior study. Prior images reviewed side by side.  FINDINGS Left Ventricle: Left ventricular ejection fraction, by estimation, is 60 to 65%. Left ventricular ejection fraction by 3D volume is 62 %. The left ventricle has normal function. The left ventricle has no regional wall motion abnormalities. The left ventricular internal cavity size was normal in size. There is no left ventricular hypertrophy. Left ventricular diastolic parameters were normal. Normal left ventricular filling pressure.  Right Ventricle: The right ventricular size is normal. No  increase in right ventricular wall thickness. Right ventricular systolic function is normal.  Left Atrium: Left atrial size was mildly dilated.  Right Atrium: Right atrial size was normal in size.  Pericardium: There is no evidence of pericardial effusion.  Mitral Valve: The mitral valve is normal in structure. No evidence of mitral valve regurgitation. No evidence of mitral valve stenosis.  Tricuspid Valve: The tricuspid valve is normal in structure. Tricuspid valve regurgitation is not demonstrated. No evidence of tricuspid stenosis.  Aortic Valve: The aortic valve is tricuspid. Aortic valve regurgitation is not visualized. No aortic stenosis is present.  Pulmonic Valve: The pulmonic valve was normal in structure. Pulmonic valve regurgitation is not visualized. No evidence of pulmonic stenosis.  Aorta: The aortic root is normal in size and structure.  Venous: The inferior vena cava is normal in size with greater than 50% respiratory variability, suggesting right atrial pressure of 3 mmHg.  IAS/Shunts: No atrial level shunt detected by color flow Doppler.   LEFT VENTRICLE PLAX 2D LVIDd:         4.30 cm         Diastology LVIDs:         2.30 cm         LV e' medial:    7.56 cm/s LV PW:         0.90 cm         LV E/e' medial:  8.2 LV IVS:        0.90 cm         LV e' lateral:   11.52 cm/s LVOT diam:     2.30 cm         LV E/e' lateral: 5.4 LV SV:         97 LV SV Index:   39 LVOT Area:     4.15 cm        3D Volume EF LV 3D EF:    Left ventricul ar ejection fraction by 3D volume is 62 %.  3D Volume EF: 3D EF:        62 % LV EDV:       167 ml LV ESV:       64 ml LV SV:        103 ml  RIGHT VENTRICLE RV Basal diam:  4.30 cm RV Mid  diam:    4.30 cm RV S prime:     14.40 cm/s TAPSE (M-mode): 2.5 cm  LEFT ATRIUM             Index        RIGHT ATRIUM           Index LA diam:        3.90 cm 1.57 cm/m   RA Pressure: 3.00 mmHg LA Vol (A2C):   56.1 ml 22.53 ml/m  RA  Area:     16.70 cm LA Vol (A4C):   62.8 ml 25.22 ml/m  RA Volume:   47.50 ml  19.08 ml/m LA Biplane Vol: 60.5 ml 24.30 ml/m AORTIC VALVE LVOT Vmax:   114.50 cm/s LVOT Vmean:  72.650 cm/s LVOT VTI:    0.234 m  AORTA Ao Root diam: 3.20 cm Ao Asc diam:  3.10 cm  MITRAL VALVE               TRICUSPID VALVE MV Area (PHT): cm         Estimated RAP:  3.00 mmHg MV Decel Time: 201 msec MV E velocity: 62.00 cm/s  SHUNTS MV A velocity: 59.95 cm/s  Systemic VTI:  0.23 m MV E/A ratio:  1.03        Systemic Diam: 2.30 cm  Mihai Croitoru MD Electronically signed by Thurmon Fair MD Signature Date/Time: 12/09/2022/1:57:22 PM    Final             EKG:   EKG Interpretation Date/Time:  Monday November 02 2023 08:04:28 EST Ventricular Rate:  74 PR Interval:  174 QRS Duration:  88 QT Interval:  404 QTC Calculation: 448 R Axis:   48  Text Interpretation: Normal sinus rhythm Normal ECG When compared with ECG of 17-Nov-2022 12:35, T wave inversion now evident in Inferior leads Confirmed by Tonny Bollman [phone]) on 11/02/2023 8:09:40 AM    Recent Labs: 11/12/2022: NT-Pro BNP <36; TSH 1.870 10/18/2023: ALT 81; BUN 24; Creatinine, Ser 1.34; Hemoglobin 17.5; Platelets 208; Potassium 4.0; Sodium 132  Recent Lipid Panel    Component Value Date/Time   CHOL 116 05/09/2022 0856   TRIG 135 05/09/2022 0856   HDL 29 (L) 05/09/2022 0856   CHOLHDL 4.0 05/09/2022 0856   CHOLHDL 6.6 04/06/2019 0502   VLDL 39 04/06/2019 0502   LDLCALC 63 05/09/2022 0856     Risk Assessment/Calculations:           STOP-Bang Score:  7       Physical Exam:    VS:  BP 110/70   Pulse 74   Ht 6' (1.829 m)   Wt 263 lb 3.2 oz (119.4 kg)   SpO2 97%   BMI 35.70 kg/m     Wt Readings from Last 3 Encounters:  11/02/23 263 lb 3.2 oz (119.4 kg)  10/18/23 275 lb 2.2 oz (124.8 kg)  06/13/23 275 lb 1.6 oz (124.8 kg)     GEN:  Well nourished, well developed in no acute distress HEENT: Normal NECK: No JVD; No  carotid bruits LYMPHATICS: No lymphadenopathy CARDIAC: RRR, no murmurs, rubs, gallops RESPIRATORY:  Clear to auscultation without rales, wheezing or rhonchi  ABDOMEN: Soft, non-tender, non-distended MUSCULOSKELETAL:  No edema; No deformity  SKIN: Warm and dry NEUROLOGIC:  Alert and oriented x 3 PSYCHIATRIC:  Normal affect   Assessment & Plan Essential hypertension Patient's blood pressure is well-controlled on a combination of amlodipine, losartan/HCTZ, metoprolol.  Continue current management.  Recent creatinine is 1.34  and potassium is 4.0.  Encouraged healthy lifestyle, continue to focus on weight loss and healthy diet. Coronary artery disease involving native heart without angina pectoris, unspecified vessel or lesion type Patient is stable without symptoms of angina.  He remains on aspirin for antiplatelet therapy as well as a high intensity statin drug. Type 2 diabetes mellitus with other circulatory complication, without long-term current use of insulin (HCC) Followed by his PCP.  He has lost weight and reports better glycemic control at home.  He continues on an ARB as well as metformin.  He was unable to afford insulin. Hepatic steatosis Recent transaminases moderately elevated with ALT of 81.  Working on weight loss. Mixed hyperlipidemia Treated with high intensity statin drug.  Improving his lifestyle.  LDL 63, HDL 29, triglycerides 135, cholesterol 116      Medication Adjustments/Labs and Tests Ordered: Current medicines are reviewed at length with the patient today.  Concerns regarding medicines are outlined above.  Orders Placed This Encounter  Procedures   EKG 12-Lead   No orders of the defined types were placed in this encounter.   Patient Instructions  Medication Instructions:  Your physician recommends that you continue on your current medications as directed. Please refer to the Current Medication list given to you today.  *If you need a refill on your cardiac  medications before your next appointment, please call your pharmacy*  Lab Work: None ordered today.  Testing/Procedures: None ordered today.  Follow-Up: At [facility], you and your health needs are our priority.  As part of our continuing mission to provide you with exceptional heart care, we have created designated Provider Care Teams.  These Care Teams include your primary Cardiologist (physician) and Advanced Practice Providers (APPs -  Physician Assistants and Nurse Practitioners) who all work together to provide you with the care you need, when you need it.  Your next appointment:   1 year(s)  The format for your next appointment:   In Person  Provider:   Tereso Newcomer, PA-C    Signed, Tonny Bollman, MD  11/02/2023 10:07 AM    [facility]

## 2023-12-02 ENCOUNTER — Telehealth: Payer: Self-pay | Admitting: Family

## 2023-12-15 ENCOUNTER — Ambulatory Visit: Admitting: Family

## 2023-12-31 ENCOUNTER — Other Ambulatory Visit: Payer: Self-pay | Admitting: Family

## 2023-12-31 ENCOUNTER — Other Ambulatory Visit (HOSPITAL_COMMUNITY): Payer: Self-pay

## 2023-12-31 DIAGNOSIS — E1165 Type 2 diabetes mellitus with hyperglycemia: Secondary | ICD-10-CM

## 2024-01-01 ENCOUNTER — Other Ambulatory Visit (HOSPITAL_COMMUNITY): Payer: Self-pay

## 2024-01-01 MED ORDER — METFORMIN HCL 1000 MG PO TABS
1000.0000 mg | ORAL_TABLET | Freq: Two times a day (BID) | ORAL | 1 refills | Status: DC
  Filled 2024-01-01: qty 60, 30d supply, fill #0
  Filled 2024-03-11: qty 60, 30d supply, fill #1

## 2024-01-01 NOTE — Telephone Encounter (Signed)
 Complete. Schedule appointment.

## 2024-01-07 ENCOUNTER — Other Ambulatory Visit (HOSPITAL_COMMUNITY): Payer: Self-pay

## 2024-03-11 ENCOUNTER — Other Ambulatory Visit (HOSPITAL_COMMUNITY): Payer: Self-pay

## 2024-03-11 ENCOUNTER — Other Ambulatory Visit: Payer: Self-pay | Admitting: Physician Assistant

## 2024-03-14 ENCOUNTER — Other Ambulatory Visit (HOSPITAL_COMMUNITY): Payer: Self-pay

## 2024-03-14 ENCOUNTER — Encounter (HOSPITAL_COMMUNITY): Payer: Self-pay

## 2024-03-14 MED ORDER — ROSUVASTATIN CALCIUM 40 MG PO TABS
40.0000 mg | ORAL_TABLET | Freq: Every day | ORAL | 2 refills | Status: AC
  Filled 2024-03-14: qty 90, 90d supply, fill #0

## 2024-03-14 MED ORDER — AMLODIPINE BESYLATE 5 MG PO TABS
5.0000 mg | ORAL_TABLET | Freq: Every day | ORAL | 2 refills | Status: AC
  Filled 2024-03-14: qty 90, 90d supply, fill #0

## 2024-03-22 ENCOUNTER — Other Ambulatory Visit (HOSPITAL_COMMUNITY): Payer: Self-pay

## 2024-03-29 ENCOUNTER — Telehealth: Payer: Self-pay | Admitting: Cardiovascular Disease

## 2024-03-29 NOTE — Telephone Encounter (Signed)
 Called and spoke with patient regarding pharmacist recommendations. Patient verbalized understanding and will monitor BP

## 2024-03-29 NOTE — Telephone Encounter (Signed)
 Nitric oxide max beet root plus with Nitrates. Will route to pharmD for input.

## 2024-03-29 NOTE — Telephone Encounter (Signed)
 Patient came by to see if he could start taking an over the counter supplement. Reached out to triage 03/29/2024

## 2024-03-29 NOTE — Telephone Encounter (Signed)
 Patient came in to see if he could take a new supplement. Nitric Oxide max. Beet root + nitrates. Brand: [place]. Reached out to triage and per triage to reach out to pharm D.

## 2024-05-30 ENCOUNTER — Ambulatory Visit (INDEPENDENT_AMBULATORY_CARE_PROVIDER_SITE_OTHER): Payer: 59 | Admitting: Podiatry

## 2024-05-30 ENCOUNTER — Encounter: Payer: Self-pay | Admitting: Podiatry

## 2024-05-30 DIAGNOSIS — E1159 Type 2 diabetes mellitus with other circulatory complications: Secondary | ICD-10-CM

## 2024-05-30 DIAGNOSIS — E119 Type 2 diabetes mellitus without complications: Secondary | ICD-10-CM

## 2024-05-30 NOTE — Progress Notes (Signed)
  Subjective:  Patient ID: Kevin [name], male    DOB: 1966/01/27,   MRN: [id number]  Chief Complaint  Patient presents with   Diabetes    They said I was kind of Diabetic.    58 y.o. male presents for diabetic foot check. Relates no major changes since last visit.  Denies  burning and tingling in their feet. Patient is diabetic and last A1c was  Lab Results  Component Value Date   HGBA1C 10.5 (H) 05/18/2023   .   PCP:  Lorren Greig PARAS, NP    . Denies any other pedal complaints. Denies n/v/f/c.   Past Medical History:  Diagnosis Date   Coronary artery disease    a. 04/05/19 NSTEMI, DES to pLAD, // Myoview  11/17/22: EF 67, normal perfusion; low risk   Hyperlipidemia LDL goal <70    Hypertension    NSTEMI (non-ST elevated myocardial infarction) (HCC) 04/05/2019   OSA (obstructive sleep apnea)    home sleep study on 11/18/2022 showing moderate obstructive sleep apnea with an AHI of 16.6/h and no central events.   Pre-diabetes     Objective:  Physical Exam: Vascular: DP/PT pulses 2/4 bilateral. CFT <3 seconds. Absent hair growth on digits. No edema noted.  Skin. No lacerations or abrasions bilateral feet. Nails 1-5 bilateral  are normal in appearance. .  Musculoskeletal: MMT 5/5 bilateral lower extremities in DF, PF, Inversion and Eversion. Deceased ROM in DF of ankle joint.  Neurological: Sensation intact to light touch. Protective sensation intact bilateral.    Assessment:   1. Type 2 diabetes mellitus with other circulatory complication, without long-term current use of insulin  (HCC)   2. Encounter for diabetic foot exam (HCC)       Plan:  Patient was evaluated and treated and all questions answered. -Discussed and educated patient on diabetic foot care, especially with  regards to the vascular, neurological and musculoskeletal systems.  -Stressed the importance of good glycemic control and the detriment of not  controlling glucose levels in relation to the  foot. -Discussed supportive shoes at all times and checking feet regularly.   -Answered all patient questions -Patient to return  in  1 year for DM foot check.  -Patient advised to call the office if any problems or questions arise in the meantime.   Asberry Failing, DPM

## 2024-06-08 ENCOUNTER — Other Ambulatory Visit: Payer: Self-pay | Admitting: Physician Assistant

## 2024-06-08 ENCOUNTER — Other Ambulatory Visit (HOSPITAL_COMMUNITY): Payer: Self-pay

## 2024-06-08 ENCOUNTER — Other Ambulatory Visit: Payer: Self-pay | Admitting: Family

## 2024-06-08 ENCOUNTER — Other Ambulatory Visit: Payer: Self-pay | Admitting: Cardiology

## 2024-06-08 DIAGNOSIS — E1165 Type 2 diabetes mellitus with hyperglycemia: Secondary | ICD-10-CM

## 2024-06-08 NOTE — Telephone Encounter (Signed)
 Schedule appointment. Last office visit 05/18/2023.

## 2024-06-09 ENCOUNTER — Other Ambulatory Visit (HOSPITAL_COMMUNITY): Payer: Self-pay

## 2024-06-09 ENCOUNTER — Encounter (HOSPITAL_COMMUNITY): Payer: Self-pay

## 2024-06-09 MED ORDER — LOSARTAN POTASSIUM-HCTZ 100-25 MG PO TABS
1.0000 | ORAL_TABLET | Freq: Every day | ORAL | 0 refills | Status: AC
  Filled 2024-06-09: qty 90, 90d supply, fill #0

## 2024-06-09 MED ORDER — METOPROLOL TARTRATE 25 MG PO TABS
25.0000 mg | ORAL_TABLET | Freq: Two times a day (BID) | ORAL | 1 refills | Status: AC
  Filled 2024-06-09: qty 180, 90d supply, fill #0

## 2024-06-09 NOTE — Telephone Encounter (Signed)
 Appt scheduled

## 2024-06-09 NOTE — Telephone Encounter (Signed)
 Schedule appointment. Last office visit 05/18/2023.

## 2024-06-10 ENCOUNTER — Telehealth: Payer: Self-pay

## 2024-06-10 ENCOUNTER — Ambulatory Visit: Admitting: Family

## 2024-06-10 NOTE — Telephone Encounter (Signed)
 Copied from CRM #[id number]. Topic: Clinical - Prescription Issue >> Jun 09, 2024  2:56 PM Delon T wrote: Reason for CRM: metFORMIN  (GLUCOPHAGE ) 1000 MG tablet- prescription denied due to needing an appt but is scheduled for Oct 21 and only have 2 pills left, please call to adivse if unable to fill- [phone]

## 2024-06-13 ENCOUNTER — Other Ambulatory Visit (HOSPITAL_COMMUNITY): Payer: Self-pay

## 2024-06-13 ENCOUNTER — Other Ambulatory Visit: Payer: Self-pay | Admitting: Family

## 2024-06-13 DIAGNOSIS — E1165 Type 2 diabetes mellitus with hyperglycemia: Secondary | ICD-10-CM

## 2024-06-13 MED ORDER — METFORMIN HCL 1000 MG PO TABS
1000.0000 mg | ORAL_TABLET | Freq: Two times a day (BID) | ORAL | 0 refills | Status: DC
  Filled 2024-06-13: qty 180, 90d supply, fill #0

## 2024-06-13 NOTE — Telephone Encounter (Signed)
 Complete

## 2024-06-14 NOTE — Telephone Encounter (Signed)
 I called patient to make him aware that prescription has been sent in.  No one answered so I left a voicemail to return my call.

## 2024-07-26 ENCOUNTER — Ambulatory Visit (INDEPENDENT_AMBULATORY_CARE_PROVIDER_SITE_OTHER): Admitting: Family

## 2024-07-26 ENCOUNTER — Encounter: Payer: Self-pay | Admitting: Family

## 2024-07-26 ENCOUNTER — Other Ambulatory Visit (HOSPITAL_COMMUNITY): Payer: Self-pay

## 2024-07-26 VITALS — BP 128/85 | HR 79 | Temp 98.6°F | Resp 16 | Ht 72.0 in | Wt 252.4 lb

## 2024-07-26 DIAGNOSIS — Z7984 Long term (current) use of oral hypoglycemic drugs: Secondary | ICD-10-CM | POA: Diagnosis not present

## 2024-07-26 DIAGNOSIS — E1165 Type 2 diabetes mellitus with hyperglycemia: Secondary | ICD-10-CM

## 2024-07-26 DIAGNOSIS — E119 Type 2 diabetes mellitus without complications: Secondary | ICD-10-CM

## 2024-07-26 MED ORDER — METFORMIN HCL 1000 MG PO TABS
1000.0000 mg | ORAL_TABLET | Freq: Two times a day (BID) | ORAL | 0 refills | Status: AC
Start: 2024-07-26 — End: 2024-10-24
  Filled 2024-07-26: qty 180, 90d supply, fill #0

## 2024-07-26 NOTE — Progress Notes (Signed)
 Medication refill

## 2024-07-26 NOTE — Progress Notes (Signed)
 Patient ID: Kevin [name], male    DOB: 1965-10-23  MRN: [id number]  CC: Chronic Conditions Follow-Up  Subjective: Kevin [name] is a 58 y.o. male who presents for chronic conditions follow-up.   His concerns today include:  - Doing well on Metformin , no issues/concerns. Denies red flag symptoms associated with diabetes.  - Due for diabetic eye exam.  Patient Active Problem List   Diagnosis Date Noted   Anxiety and depression 02/25/2023   Hyperglycemia 01/28/2023   Hepatic steatosis 01/28/2023   OSA (obstructive sleep apnea) 01/08/2023   Snoring 11/12/2022   Daytime sleepiness 11/12/2022   SOB (shortness of breath) 02/05/2022   Fatigue 02/05/2022   Leg wound, right 06/22/2020   CAD (coronary artery disease) 04/15/2019   Hyperlipidemia 04/06/2019   Essential hypertension 04/06/2019   DM (diabetes mellitus) (HCC) 04/06/2019   s/p NSTEMI in 03/2019 04/05/2019     Current Outpatient Medications on File Prior to Visit  Medication Sig Dispense Refill   amLODipine  (NORVASC ) 5 MG tablet Take 1 tablet (5 mg total) by mouth daily. 90 tablet 2   aspirin  81 MG chewable tablet Chew 1 tablet (81 mg total) by mouth daily. 90 tablet 1   losartan -hydrochlorothiazide  (HYZAAR ) 100-25 MG tablet Take 1 tablet by mouth daily. Please call [phone] to schedule a January appointment for future refills. Thank you. 90 tablet 0   metoprolol  tartrate (LOPRESSOR ) 25 MG tablet Take 1 tablet (25 mg total) by mouth 2 (two) times daily. 180 tablet 1   rosuvastatin  (CRESTOR ) 40 MG tablet Take 1 tablet (40 mg total) by mouth daily. 90 tablet 2   Current Facility-Administered Medications on File Prior to Visit  Medication Dose Route Frequency Provider Last Rate Last Admin   regadenoson  (LEXISCAN ) injection SOLN 0.4 mg  0.4 mg Intravenous Once Branch, Ronal BRAVO, MD        No Known Allergies  Social History   Socioeconomic History   Marital status: Single    Spouse name: Not on file   Number of  children: 2   Years of education: Not on file   Highest education level: Not on file  Occupational History   Occupation: plumber's helper  Tobacco Use   Smoking status: Former    Current packs/day: 0.00    Types: Cigarettes    Quit date: 10/06/2006    Years since quitting: 17.8   Smokeless tobacco: Never  Vaping Use   Vaping status: Never Used  Substance and Sexual Activity   Alcohol use: Not Currently    Comment: He used to drink alcohol moderately, quit in 2020   Drug use: No   Sexual activity: Not on file  Other Topics Concern   Not on file  Social History Narrative   Lives alone in [place]   Social Drivers of Health   Financial Resource Strain: Low Risk  (07/26/2024)   Overall Financial Resource Strain (CARDIA)    Difficulty of Paying Living Expenses: Not hard at all  Food Insecurity: No Food Insecurity (07/26/2024)   Hunger Vital Sign    Worried About Running Out of Food in the Last Year: Never true    Ran Out of Food in the Last Year: Never true  Transportation Needs: No Transportation Needs (07/26/2024)   PRAPARE - Administrator, Civil Service (Medical): No    Lack of Transportation (Non-Medical): No  Physical Activity: Insufficiently Active (07/26/2024)   Exercise Vital Sign    Days of Exercise per Week: 2 days  Minutes of Exercise per Session: 60 min  Stress: No Stress Concern Present (07/26/2024)   [employer] of Occupational Health - Occupational Stress Questionnaire    Feeling of Stress: Not at all  Social Connections: Socially Isolated (07/26/2024)   Social Connection and Isolation Panel    Frequency of Communication with Friends and Family: Once a week    Frequency of Social Gatherings with Friends and Family: Never    Attends Religious Services: Never    Database administrator or Organizations: No    Attends Banker Meetings: Never    Marital Status: Divorced  Catering manager Violence: Not At Risk (07/26/2024)    Humiliation, Afraid, Rape, and Kick questionnaire    Fear of Current or Ex-Partner: No    Emotionally Abused: No    Physically Abused: No    Sexually Abused: No    Family History  Problem Relation Age of Onset   Hyperlipidemia Mother    Diabetes Father    Hypertension Father    Bladder Cancer Father    Hypertension Maternal Grandmother    Diabetes Maternal Grandmother    Kidney disease Maternal Grandmother    CAD Maternal Grandfather        died of MI at 86   Hypertension Maternal Grandfather    CAD Paternal Grandmother    Diabetes Paternal Grandmother     Past Surgical History:  Procedure Laterality Date   CORONARY STENT INTERVENTION N/A 04/05/2019   Procedure: CORONARY STENT INTERVENTION;  Surgeon: Wonda Sharper, MD;  Location: [facility] INVASIVE CV LAB;  Service: Cardiovascular;  Laterality: N/A;   LEFT HEART CATH AND CORONARY ANGIOGRAPHY N/A 04/05/2019   Procedure: LEFT HEART CATH AND CORONARY ANGIOGRAPHY;  Surgeon: Wonda Sharper, MD;  Location: [facility] INVASIVE CV LAB;  Service: Cardiovascular;  Laterality: N/A;    ROS: Review of Systems Negative except as stated above  PHYSICAL EXAM: BP 128/85   Pulse 79   Temp 98.6 F (37 C) (Oral)   Resp 16   Ht 6' (1.829 m)   Wt 252 lb 6.4 oz (114.5 kg)   SpO2 95%   BMI 34.23 kg/m   Physical Exam HENT:     Head: Normocephalic and atraumatic.     Nose: Nose normal.     Mouth/Throat:     Mouth: Mucous membranes are moist.     Pharynx: Oropharynx is clear.  Eyes:     Extraocular Movements: Extraocular movements intact.     Conjunctiva/sclera: Conjunctivae normal.     Pupils: Pupils are equal, round, and reactive to light.  Cardiovascular:     Rate and Rhythm: Normal rate and regular rhythm.     Pulses: Normal pulses.     Heart sounds: Normal heart sounds.  Pulmonary:     Effort: Pulmonary effort is normal.     Breath sounds: Normal breath sounds.  Musculoskeletal:        General: Normal range of motion.     Cervical  back: Normal range of motion and neck supple.  Neurological:     General: No focal deficit present.     Mental Status: He is alert and oriented to person, place, and time.  Psychiatric:        Mood and Affect: Mood normal.        Behavior: Behavior normal.     ASSESSMENT AND PLAN: 1. Type 2 diabetes mellitus with hyperglycemia, without long-term current use of insulin  (HCC) (Primary) - Hemoglobin A1c result pending.  - Continue Metformin   as prescribed. - Routine screening.  - Discussed the importance of healthy eating habits, low-carbohydrate diet, low-sugar diet, regular aerobic exercise (at least 150 minutes a week as tolerated) and medication compliance to achieve or maintain control of diabetes. Counseled on medication adherence/adverse effects.  - Follow-up with primary provider as scheduled.  - metFORMIN  (GLUCOPHAGE ) 1000 MG tablet; Take 1 tablet (1,000 mg total) by mouth 2 (two) times daily with a meal.  Dispense: 180 tablet; Refill: 0 - Microalbumin / creatinine urine ratio - Hemoglobin A1c - Basic Metabolic Panel  2. Diabetic eye exam [facility]) - Referral to Ophthalmology for evaluation/management. - Ambulatory referral to Ophthalmology   Patient was given the opportunity to ask questions.  Patient verbalized understanding of the plan and was able to repeat key elements of the plan. Patient was given clear instructions to go to Emergency Department or return to medical center if symptoms don't improve, worsen, or new problems develop.The patient verbalized understanding.   Orders Placed This Encounter  Procedures   Microalbumin / creatinine urine ratio   Hemoglobin A1c   Basic Metabolic Panel   Ambulatory referral to Ophthalmology     Requested Prescriptions   Signed Prescriptions Disp Refills   metFORMIN  (GLUCOPHAGE ) 1000 MG tablet 180 tablet 0    Sig: Take 1 tablet (1,000 mg total) by mouth 2 (two) times daily with a meal.    Return in about 3 months (around  10/26/2024) for Follow-Up or next available chronic conditions.  Greig JINNY Drones, NP

## 2024-07-27 ENCOUNTER — Ambulatory Visit: Payer: Self-pay | Admitting: Family

## 2024-07-27 ENCOUNTER — Other Ambulatory Visit (HOSPITAL_COMMUNITY): Payer: Self-pay

## 2024-07-27 DIAGNOSIS — E1165 Type 2 diabetes mellitus with hyperglycemia: Secondary | ICD-10-CM

## 2024-07-27 LAB — MICROALBUMIN / CREATININE URINE RATIO
Creatinine, Urine: 129.9 mg/dL
Microalb/Creat Ratio: 28 mg/g{creat} (ref 0–29)
Microalbumin, Urine: 37 ug/mL

## 2024-07-27 LAB — HEMOGLOBIN A1C
Est. average glucose Bld gHb Est-mCnc: 295 mg/dL
Hgb A1c MFr Bld: 11.9 % — ABNORMAL HIGH (ref 4.8–5.6)

## 2024-07-27 LAB — BASIC METABOLIC PANEL WITH GFR
BUN/Creatinine Ratio: 10 (ref 9–20)
BUN: 11 mg/dL (ref 6–24)
CO2: 20 mmol/L (ref 20–29)
Calcium: 10.3 mg/dL — ABNORMAL HIGH (ref 8.7–10.2)
Chloride: 97 mmol/L (ref 96–106)
Creatinine, Ser: 1.08 mg/dL (ref 0.76–1.27)
Glucose: 395 mg/dL — ABNORMAL HIGH (ref 70–99)
Potassium: 5 mmol/L (ref 3.5–5.2)
Sodium: 134 mmol/L (ref 134–144)
eGFR: 80 mL/min/1.73 (ref >59)

## 2024-07-27 MED ORDER — INSULIN GLARGINE 100 UNIT/ML SOLOSTAR PEN
10.0000 [IU] | PEN_INJECTOR | Freq: Every day | SUBCUTANEOUS | 1 refills | Status: AC
  Filled 2024-07-27 (×2): qty 9, 84d supply, fill #0

## 2024-07-27 MED ORDER — PEN NEEDLES 31G X 8 MM MISC
1.0000 | Freq: Every day | 12 refills | Status: AC
  Filled 2024-07-27: qty 100, 34d supply, fill #0

## 2024-08-08 ENCOUNTER — Other Ambulatory Visit (HOSPITAL_COMMUNITY): Payer: Self-pay

## 2024-08-09 ENCOUNTER — Other Ambulatory Visit (HOSPITAL_COMMUNITY): Payer: Self-pay

## 2024-08-22 ENCOUNTER — Other Ambulatory Visit (HOSPITAL_COMMUNITY): Payer: Self-pay

## 2024-08-30 ENCOUNTER — Encounter: Payer: Self-pay | Admitting: Cardiovascular Disease

## 2024-10-26 ENCOUNTER — Ambulatory Visit: Admitting: Family

## 2024-11-01 ENCOUNTER — Ambulatory Visit: Admitting: Family

## 2024-11-16 ENCOUNTER — Ambulatory Visit: Admitting: Physician Assistant

## 2024-11-22 ENCOUNTER — Ambulatory Visit: Admitting: Family

## 2025-05-29 ENCOUNTER — Ambulatory Visit: Admitting: Podiatry

## 2025-05-31 ENCOUNTER — Ambulatory Visit: Admitting: Podiatry
